# Patient Record
Sex: Female | Born: 1970 | Race: Black or African American | Hispanic: No | Smoking: Never smoker
Health system: Southern US, Community
[De-identification: ages and names within clinical notes are randomized; demographics above are authoritative.]

## PROBLEM LIST (undated history)

## (undated) DIAGNOSIS — Z9289 Personal history of other medical treatment: Secondary | ICD-10-CM

## (undated) DIAGNOSIS — C801 Malignant (primary) neoplasm, unspecified: Secondary | ICD-10-CM

## (undated) DIAGNOSIS — R51 Headache: Secondary | ICD-10-CM

## (undated) DIAGNOSIS — I10 Essential (primary) hypertension: Secondary | ICD-10-CM

## (undated) DIAGNOSIS — L709 Acne, unspecified: Secondary | ICD-10-CM

## (undated) HISTORY — DX: Acne, unspecified: L70.9

---

## 1998-01-21 ENCOUNTER — Other Ambulatory Visit: Admission: RE | Admit: 1998-01-21 | Discharge: 1998-01-21 | Payer: Self-pay | Admitting: Obstetrics & Gynecology

## 1998-06-29 HISTORY — PX: MYOMECTOMY: SHX85

## 1998-09-02 ENCOUNTER — Encounter: Payer: Self-pay | Admitting: Obstetrics & Gynecology

## 1998-09-02 ENCOUNTER — Ambulatory Visit (HOSPITAL_COMMUNITY): Admission: RE | Admit: 1998-09-02 | Discharge: 1998-09-02 | Payer: Self-pay | Admitting: Obstetrics & Gynecology

## 2001-02-03 ENCOUNTER — Other Ambulatory Visit: Admission: RE | Admit: 2001-02-03 | Discharge: 2001-02-03 | Payer: Self-pay | Admitting: Gynecology

## 2001-07-01 ENCOUNTER — Encounter (INDEPENDENT_AMBULATORY_CARE_PROVIDER_SITE_OTHER): Payer: Self-pay | Admitting: Specialist

## 2001-07-01 ENCOUNTER — Inpatient Hospital Stay (HOSPITAL_COMMUNITY): Admission: RE | Admit: 2001-07-01 | Discharge: 2001-07-02 | Payer: Self-pay | Admitting: Gynecology

## 2002-01-31 ENCOUNTER — Other Ambulatory Visit: Admission: RE | Admit: 2002-01-31 | Discharge: 2002-01-31 | Payer: Self-pay | Admitting: Gynecology

## 2002-11-02 ENCOUNTER — Other Ambulatory Visit: Admission: RE | Admit: 2002-11-02 | Discharge: 2002-11-02 | Payer: Self-pay | Admitting: Obstetrics and Gynecology

## 2002-11-03 ENCOUNTER — Encounter (INDEPENDENT_AMBULATORY_CARE_PROVIDER_SITE_OTHER): Payer: Self-pay | Admitting: *Deleted

## 2002-11-03 ENCOUNTER — Ambulatory Visit (HOSPITAL_COMMUNITY): Admission: AD | Admit: 2002-11-03 | Discharge: 2002-11-03 | Payer: Self-pay | Admitting: Obstetrics and Gynecology

## 2003-06-30 DIAGNOSIS — C801 Malignant (primary) neoplasm, unspecified: Secondary | ICD-10-CM

## 2003-06-30 DIAGNOSIS — Z9289 Personal history of other medical treatment: Secondary | ICD-10-CM

## 2003-06-30 HISTORY — PX: DILATION AND CURETTAGE OF UTERUS: SHX78

## 2003-06-30 HISTORY — DX: Malignant (primary) neoplasm, unspecified: C80.1

## 2003-06-30 HISTORY — PX: CHEST WALL BIOPSY: SHX1338

## 2003-06-30 HISTORY — DX: Personal history of other medical treatment: Z92.89

## 2003-07-26 ENCOUNTER — Emergency Department (HOSPITAL_COMMUNITY): Admission: EM | Admit: 2003-07-26 | Discharge: 2003-07-26 | Payer: Self-pay | Admitting: Emergency Medicine

## 2003-08-03 ENCOUNTER — Inpatient Hospital Stay (HOSPITAL_COMMUNITY): Admission: AD | Admit: 2003-08-03 | Discharge: 2003-08-07 | Payer: Self-pay | Admitting: Internal Medicine

## 2003-08-06 ENCOUNTER — Encounter (INDEPENDENT_AMBULATORY_CARE_PROVIDER_SITE_OTHER): Payer: Self-pay | Admitting: Specialist

## 2003-08-10 ENCOUNTER — Other Ambulatory Visit: Admission: RE | Admit: 2003-08-10 | Discharge: 2003-08-10 | Payer: Self-pay | Admitting: Obstetrics and Gynecology

## 2003-08-31 ENCOUNTER — Ambulatory Visit (HOSPITAL_BASED_OUTPATIENT_CLINIC_OR_DEPARTMENT_OTHER): Admission: RE | Admit: 2003-08-31 | Discharge: 2003-08-31 | Payer: Self-pay | Admitting: General Surgery

## 2003-08-31 ENCOUNTER — Ambulatory Visit (HOSPITAL_COMMUNITY): Admission: RE | Admit: 2003-08-31 | Discharge: 2003-08-31 | Payer: Self-pay | Admitting: General Surgery

## 2003-08-31 ENCOUNTER — Encounter: Admission: RE | Admit: 2003-08-31 | Discharge: 2003-08-31 | Payer: Self-pay | Admitting: General Surgery

## 2003-08-31 ENCOUNTER — Encounter (INDEPENDENT_AMBULATORY_CARE_PROVIDER_SITE_OTHER): Payer: Self-pay | Admitting: General Surgery

## 2003-08-31 ENCOUNTER — Encounter (INDEPENDENT_AMBULATORY_CARE_PROVIDER_SITE_OTHER): Payer: Self-pay | Admitting: Specialist

## 2003-10-19 ENCOUNTER — Ambulatory Visit (HOSPITAL_COMMUNITY): Admission: RE | Admit: 2003-10-19 | Discharge: 2003-10-19 | Payer: Self-pay | Admitting: Oncology

## 2004-01-08 ENCOUNTER — Encounter (HOSPITAL_COMMUNITY): Admission: RE | Admit: 2004-01-08 | Discharge: 2004-03-28 | Payer: Self-pay | Admitting: Oncology

## 2004-01-14 ENCOUNTER — Ambulatory Visit (HOSPITAL_COMMUNITY): Admission: RE | Admit: 2004-01-14 | Discharge: 2004-01-14 | Payer: Self-pay | Admitting: Oncology

## 2004-02-11 ENCOUNTER — Ambulatory Visit (HOSPITAL_COMMUNITY): Admission: RE | Admit: 2004-02-11 | Discharge: 2004-02-11 | Payer: Self-pay | Admitting: Oncology

## 2004-05-12 ENCOUNTER — Ambulatory Visit: Payer: Self-pay | Admitting: Oncology

## 2004-08-07 ENCOUNTER — Other Ambulatory Visit: Admission: RE | Admit: 2004-08-07 | Discharge: 2004-08-07 | Payer: Self-pay | Admitting: Obstetrics and Gynecology

## 2004-10-18 IMAGING — CT CT CHEST W/ CM
1 of 2 series · 14 of 32 positions shown, 18 images · IV contrast (agent unspecified)
Comparison: none

CLINICAL DATA: Chest pain. 10 weeks pregnant.  Mediastinal and hilar adenopathy on single PA view chest x-ray on 07/26/03.  Question pulmonary embolus.
 CT CHEST WITH CONTRAST ? 08/03/03

[Series 4: chest/pe 1.0 b10f · axial · 0.59mm/px · z∈[+495,+729]mm · 14 of 260 slices shown, 18 images]
[im 13/260  mediastinal]
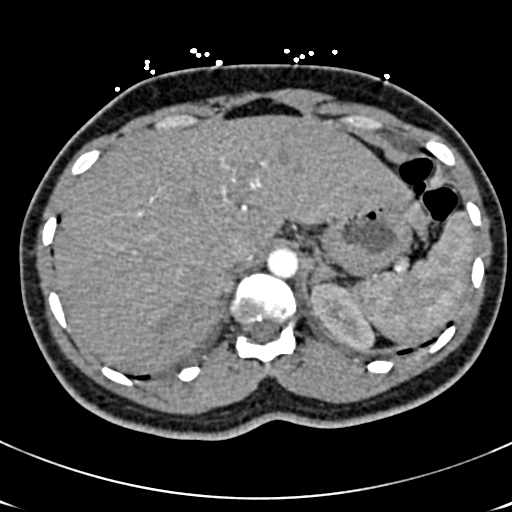
[im 13/260  lung]
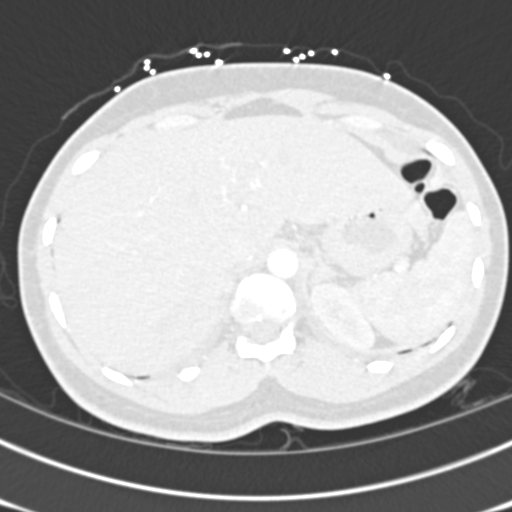
[im 39/260  lung]
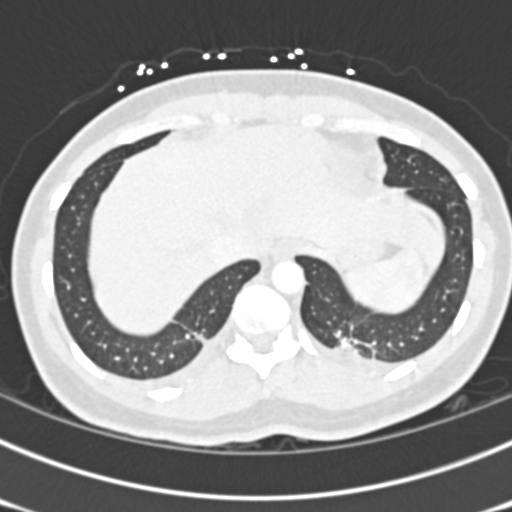
[im 52/260  lung]
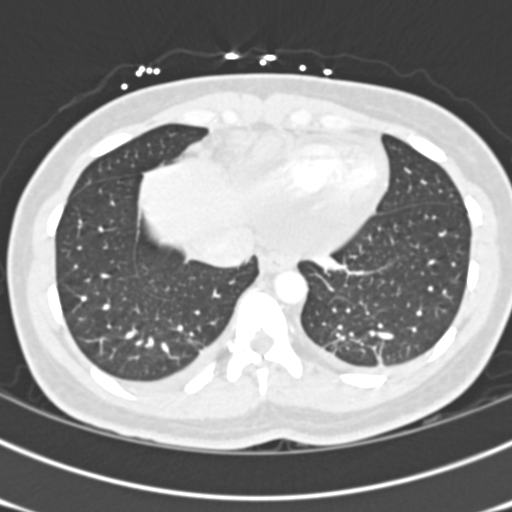
[im 78/260  lung]
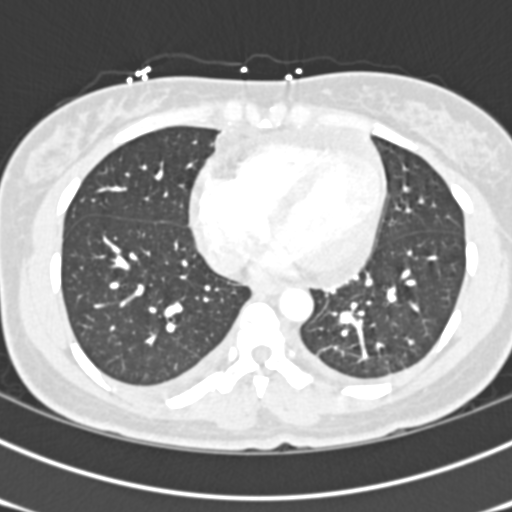
[im 91/260  mediastinal]
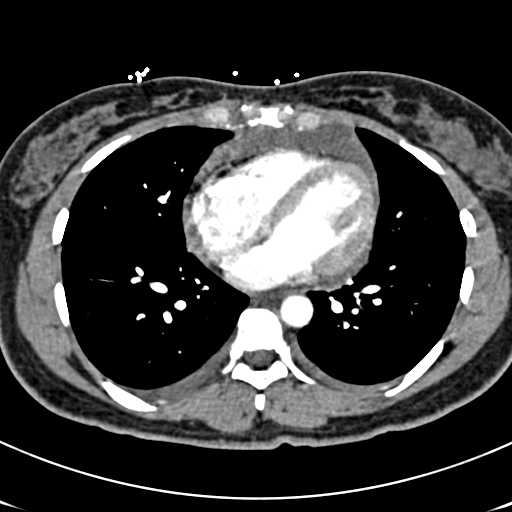
[im 91/260  lung]
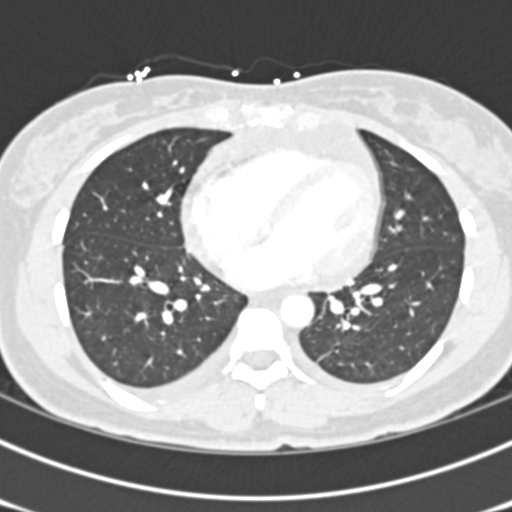
[im 104/260  lung]
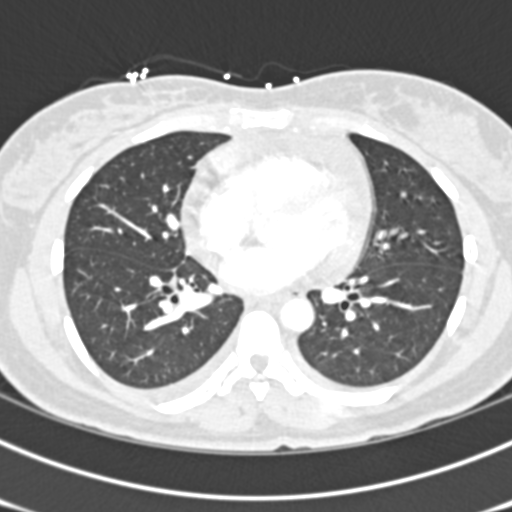
[im 121/260  lung]
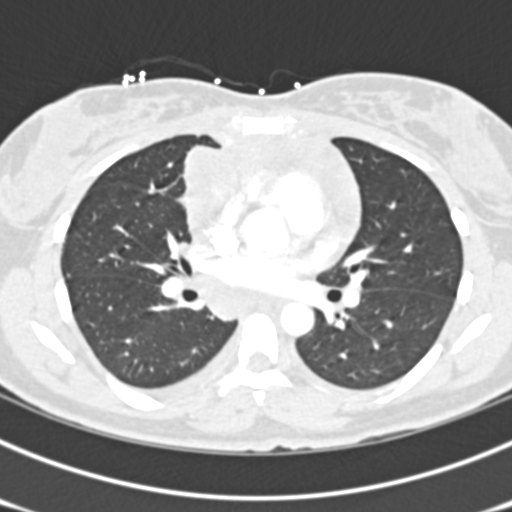
[im 130/260  lung]
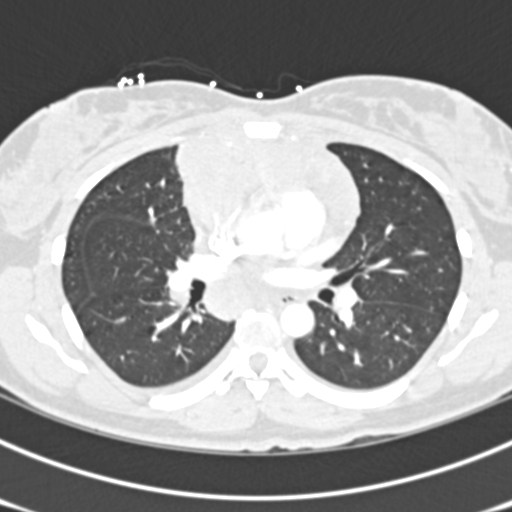
[im 156/260  mediastinal]
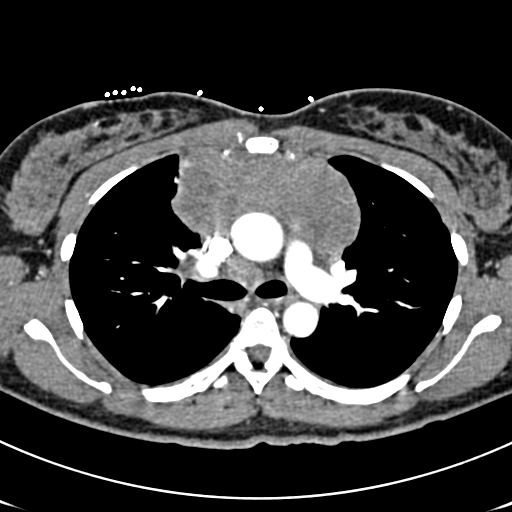
[im 156/260  lung]
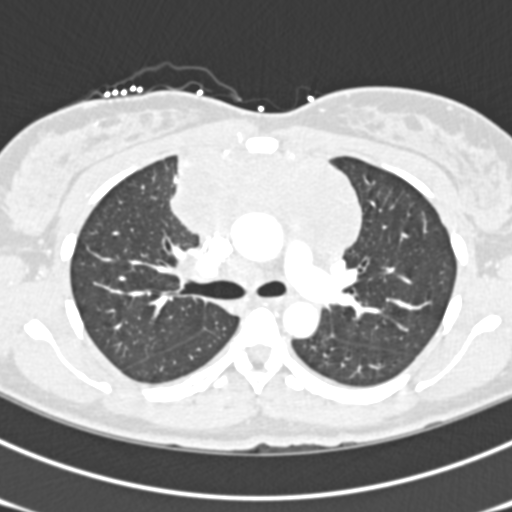
[im 169/260  lung]
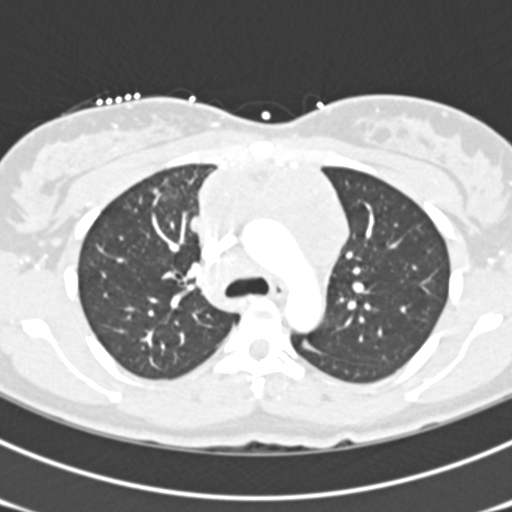
[im 182/260  lung]
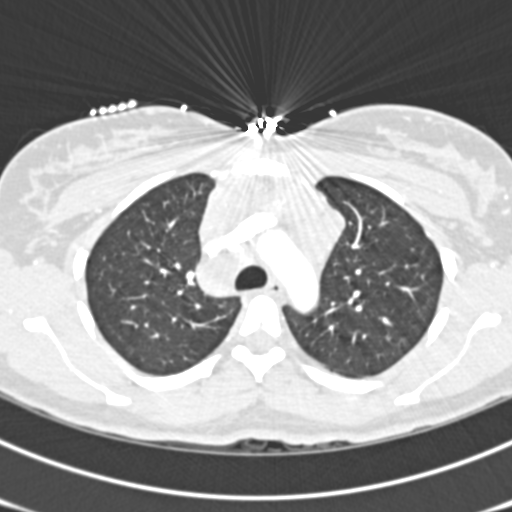
[im 208/260  lung]
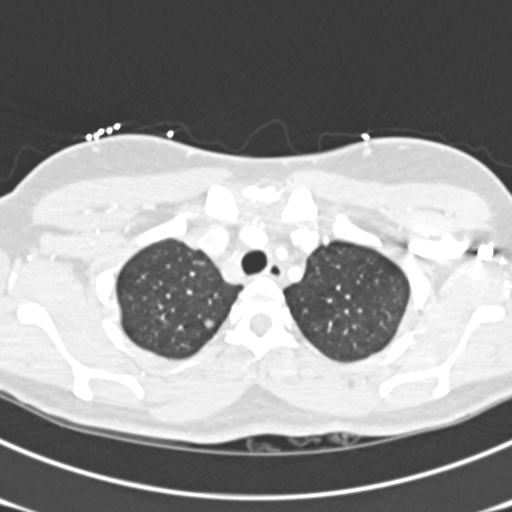
[im 221/260  mediastinal]
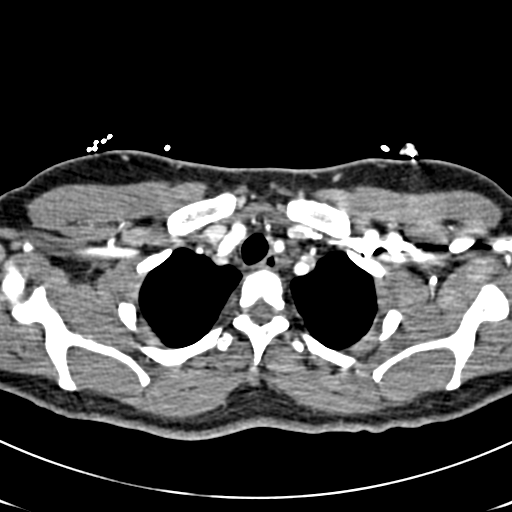
[im 221/260  lung]
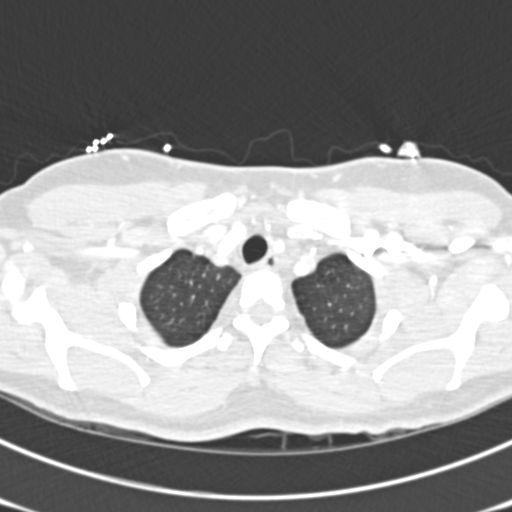
[im 247/260  lung]
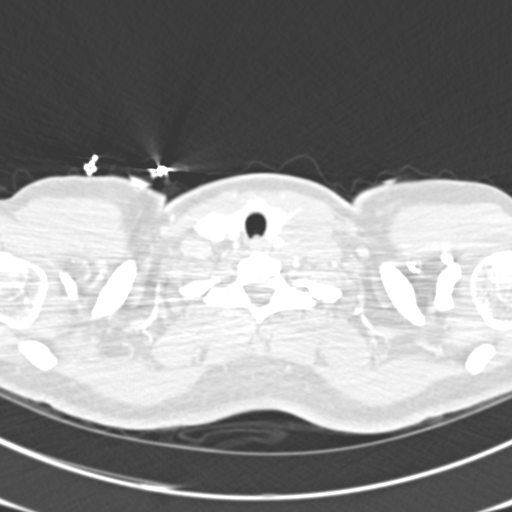

[14 of 32 positions shown; findings below may reference images not displayed]

FINDINGS: Following signed, informed consent for the study 10 weeks pregnant, multidetector spiral axial images were obtained through the thorax with IV injection of 150 cc of Omnipaque 300 with pulmonary embolism protocol.  Abdominal and pelvic shielding with lead aprons.  Comparison with single PA chest x-ray of 07/26/03.
 No CT evidence for pulmonary embolus is seen.  As seen on the abdominal ultrasound of 08/03/03, there are small posterior layering bilateral pleural effusions.  Small to moderate sized pericardial effusion measures maximally anteriorly 1.8 cm (image #58).  As seen on previous chest x-ray, there is marked mediastinal adenopathy extending from the superior mediastinum anteriorly and at the right paratracheal region, continuing to the mid heart level.  The large anterior mediastinal adenopathy measures 3.6 cm AP x 11.4 cm wide (image #37).  Lesser precarinal, subcarinal, and 2.8 cm right hilar adenopathy is noted.  A 2.5 cm right azygoesophageal recess adenopathy and a 2.7 cm anterior right cardiophrenic adenopathy are seen as well.  A 3.3 cm left inferior cervical adenopathy causes marked compression on the left internal jugular vein.  
 The visualized liver and spleen appear normal in size with no focal lesions.  No upper abdominal adenopathy is noted.
 A 6 mm medial right upper lobe (image #19) with small 5 mm left upper lobe subpleural (image #24) noncalcified nodules are seen.  Minimal left greater than right posteromedial subsegmental atelectasis is seen.  The lungs are otherwise clear.  
 IMPRESSION
 As seen on PA chest x-ray of 07/26/03:
 1.  Extensive mediastinal with slight to moderate right hilar adenopathy.  No CT evidence for significant left hilar adenopathy.  Additional left inferior cervical adenopathy is noted.  Again differential diagnosis includes sarcoidosis, lymphoma, and less likely tuberculosis.
 2.  Noncalcified bilateral upper lobe nodules may represent granulomatous disease such as sarcoidosis.
 3.  Slight bibasilar subsegmental atelectasis.
 4.  Slight to moderate pericardial effusion.
 5.  Small right greater than left pleural effusions.
 6.  No CT evidence for pulmonary embolus.

## 2004-10-18 IMAGING — US US ABDOMEN COMPLETE
1 series · 14 of 25 positions shown · non-contrast
Comparison: none

CLINICAL DATA: Right chest pain.  Myomectomy.  10 weeks pregnant.
ULTRASOUND ABDOMEN COMPLETE ? 08/03/03
There is no evidence of gallstones or gallbladder wall thickening. There is no evidence of biliary ductal dilatation. The liver is within normal limits in echogenicity and no focal parenchymal lesions are identified. The visualized portion of the pancreas is unremarkable in appearance.

[Series 1: unknown · 0.30mm/px · 14 of 88 slices shown]
[im 1/88]
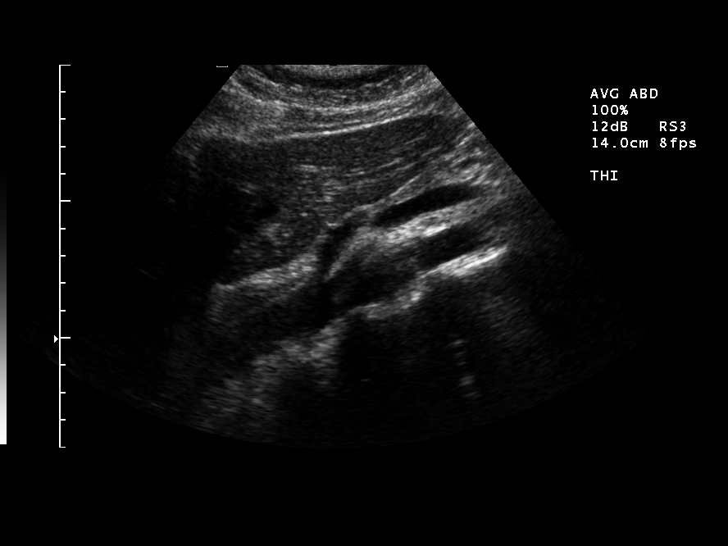
[im 8/88]
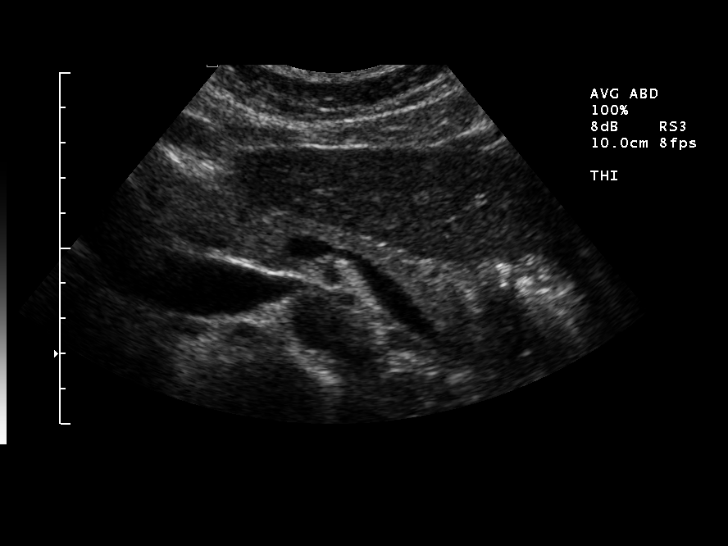
[im 15/88]
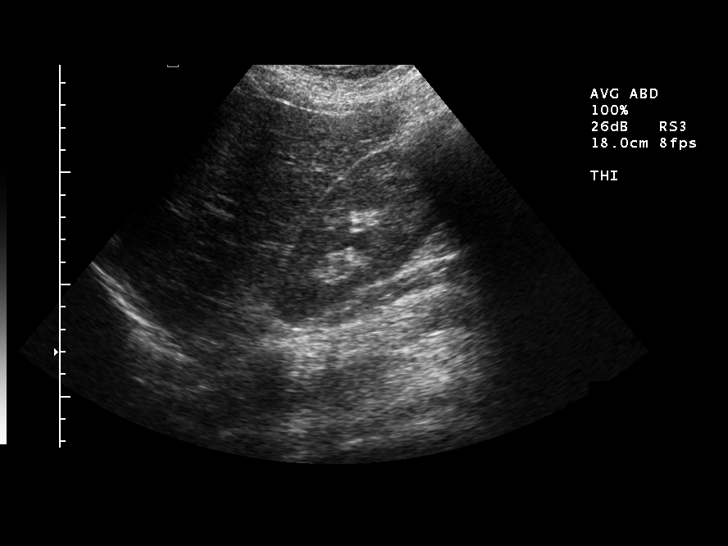
[im 22/88]
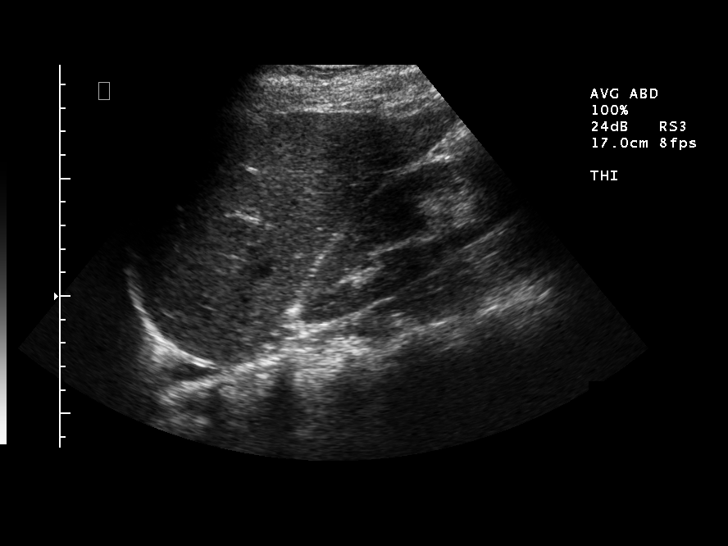
[im 30/88]
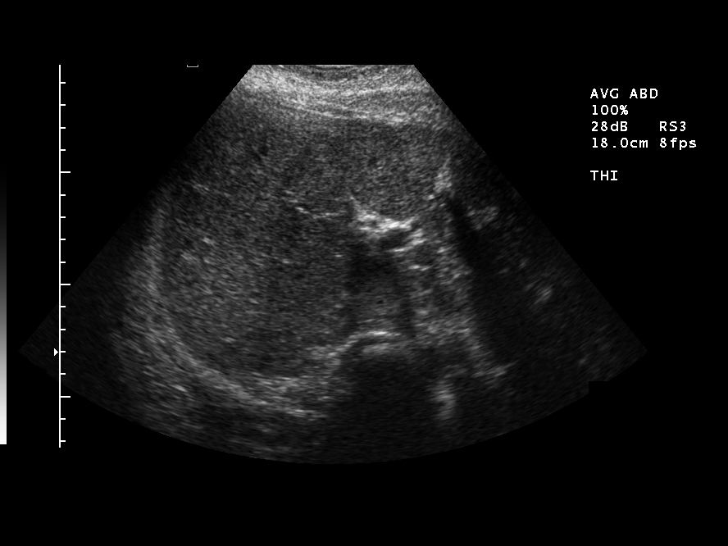
[im 33/88]
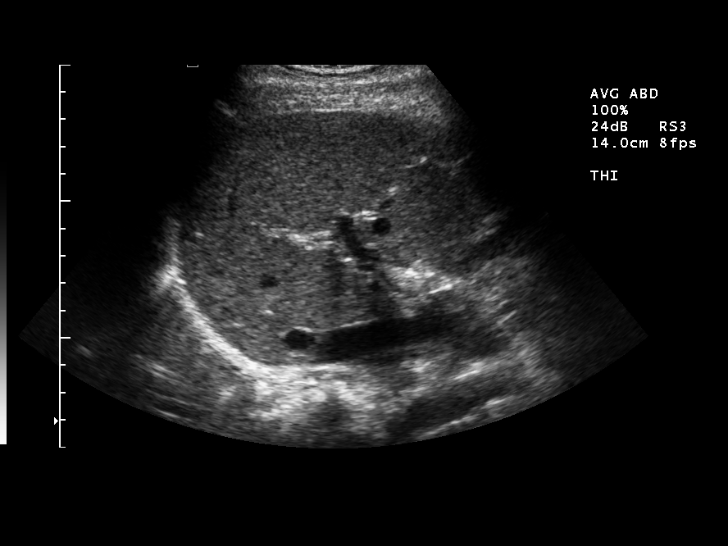
[im 40/88]
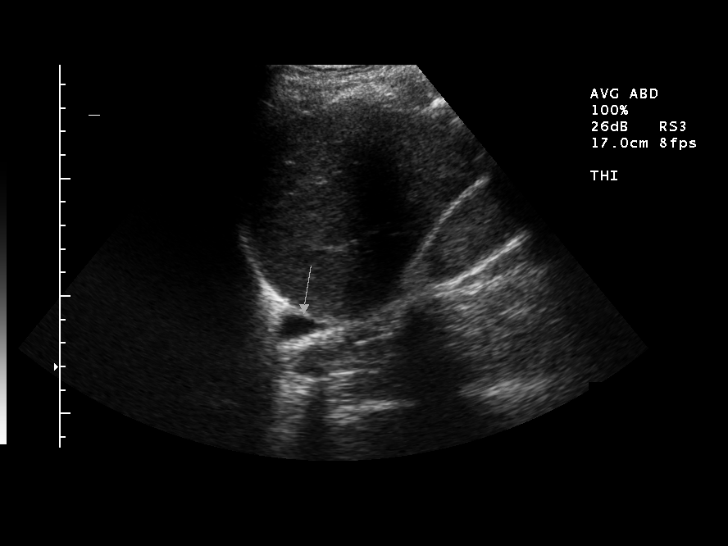
[im 48/88]
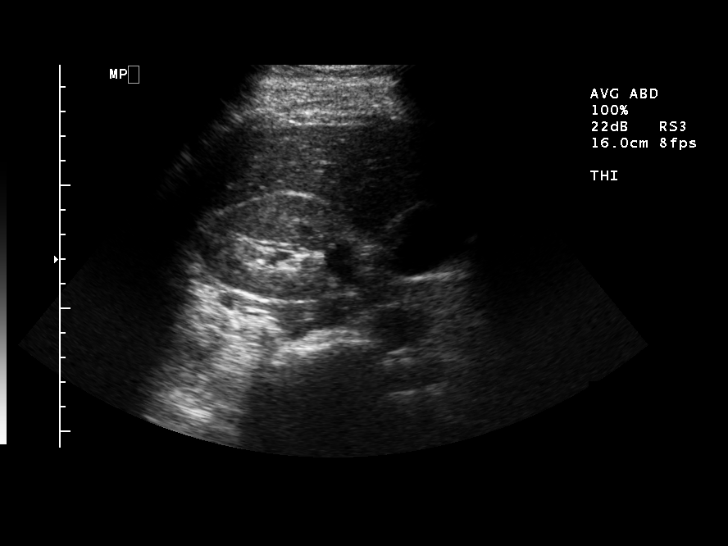
[im 55/88]
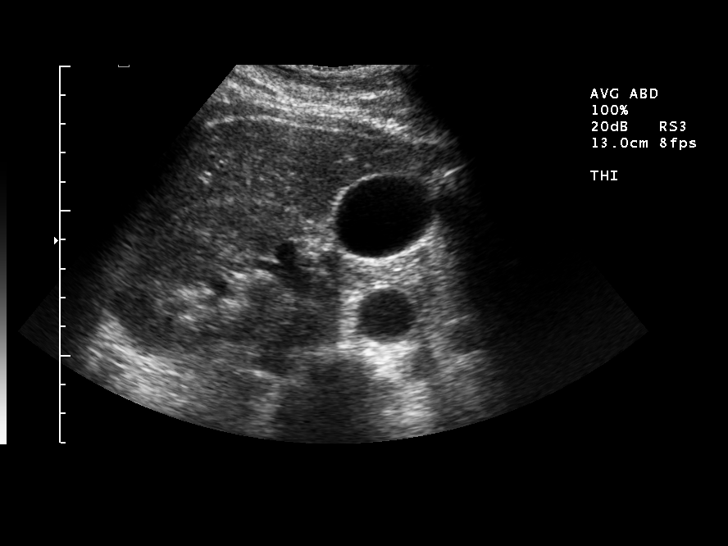
[im 59/88]
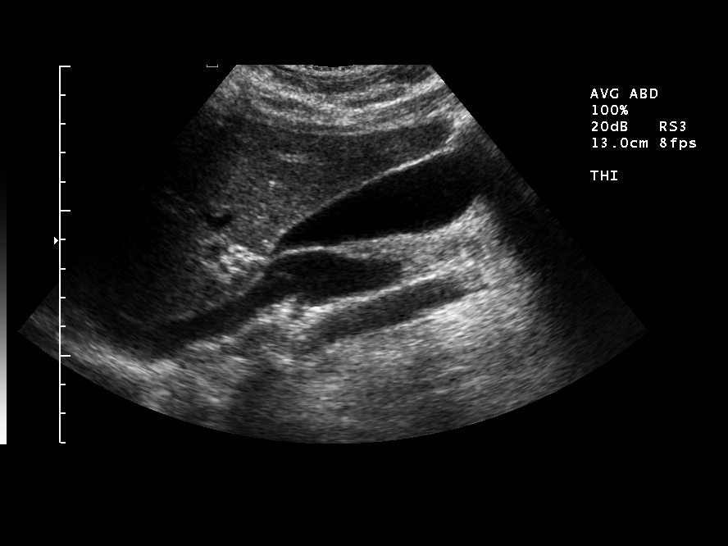
[im 66/88]
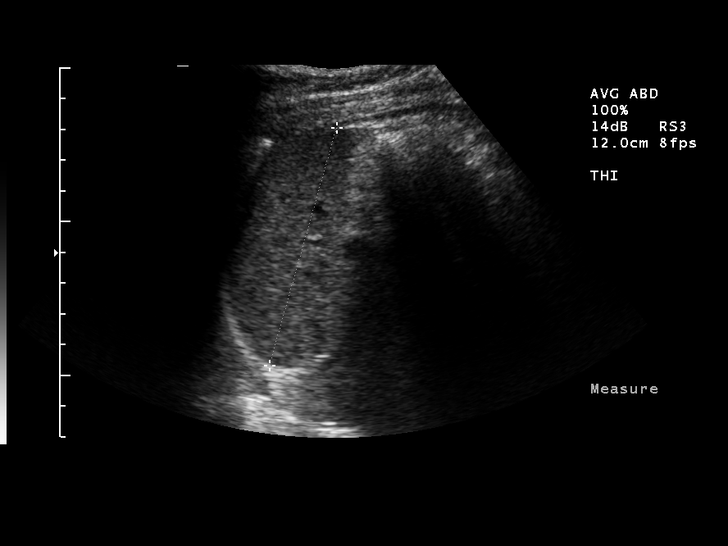
[im 73/88]
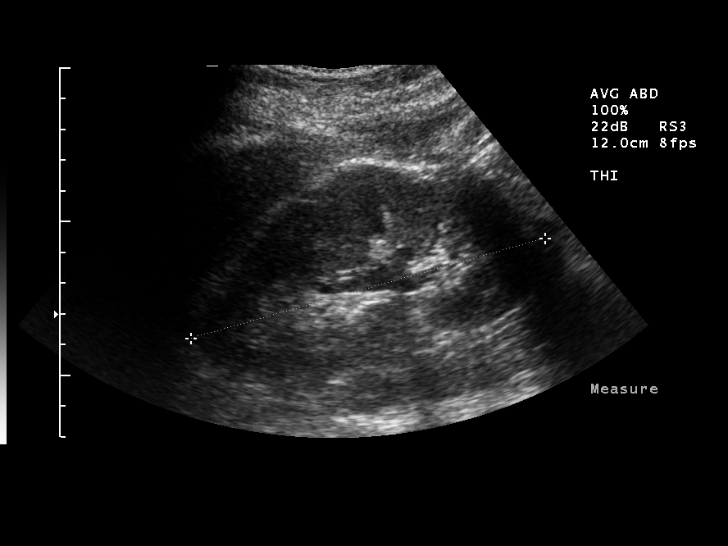
[im 80/88]
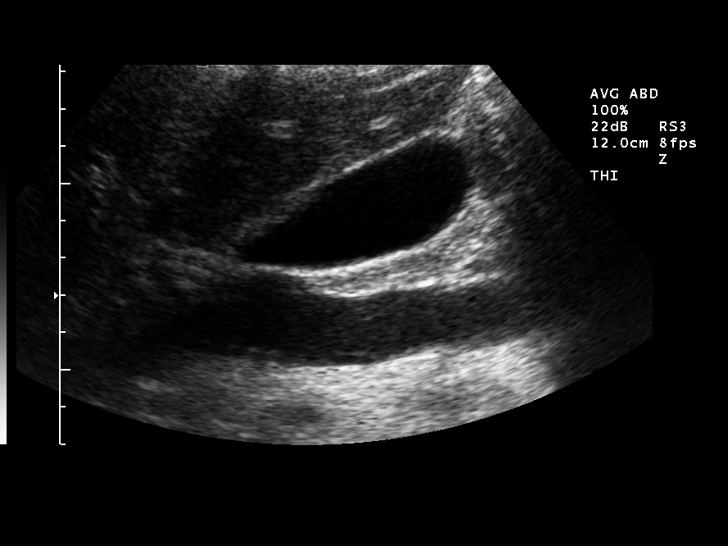
[im 88/88]
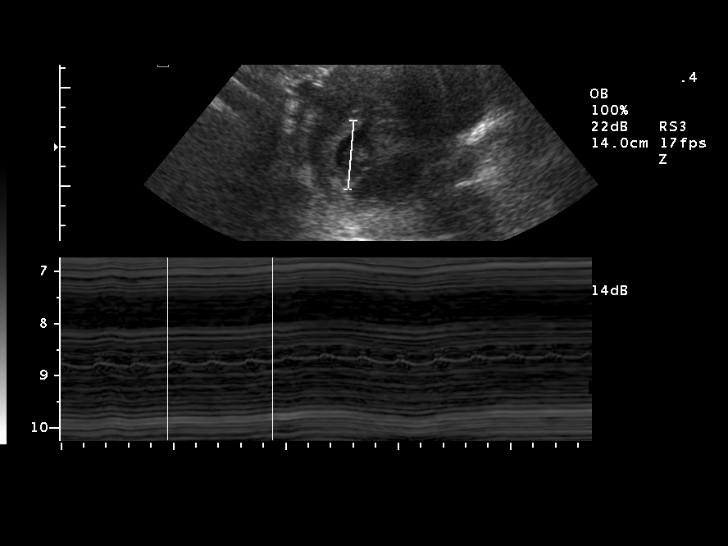

[14 of 25 positions shown; findings below may reference images not displayed]

The kidneys are within normal limits in size and echogenicity and there is no evidence of masses or hydronephrosis.  There is no evidence of splenomegaly or abdominal aortic aneurysm.  Images of the inferior vena cava are unremarkable, and there is no evidence of ascites.   A tiny right pleural effusion is noted.  Limited assessment of intrauterine single gestation shows fetal heart rate at 193 beats per minute which is normal.

IMPRESSION
1.  Tiny right pleural effusion.
2.  Single live intrauterine gestation.
3.  Otherwise normal.

## 2005-06-26 ENCOUNTER — Ambulatory Visit: Payer: Self-pay | Admitting: Oncology

## 2005-10-14 ENCOUNTER — Other Ambulatory Visit: Admission: RE | Admit: 2005-10-14 | Discharge: 2005-10-14 | Payer: Self-pay | Admitting: Physical Therapy

## 2005-12-17 ENCOUNTER — Ambulatory Visit: Payer: Self-pay | Admitting: Oncology

## 2006-05-05 ENCOUNTER — Ambulatory Visit: Payer: Self-pay | Admitting: Oncology

## 2006-05-07 ENCOUNTER — Ambulatory Visit (HOSPITAL_COMMUNITY): Admission: RE | Admit: 2006-05-07 | Discharge: 2006-05-07 | Payer: Self-pay | Admitting: Oncology

## 2006-05-07 LAB — CBC WITH DIFFERENTIAL/PLATELET
Basophils Absolute: 0 10*3/uL (ref 0.0–0.1)
EOS%: 0.9 % (ref 0.0–7.0)
Eosinophils Absolute: 0 10*3/uL (ref 0.0–0.5)
HGB: 13.1 g/dL (ref 11.6–15.9)
LYMPH%: 25.6 % (ref 14.0–48.0)
MCH: 30.4 pg (ref 26.0–34.0)
MCHC: 34.5 g/dL (ref 32.0–36.0)
MCV: 88.1 fL (ref 81.0–101.0)
MONO#: 0.6 10*3/uL (ref 0.1–0.9)
MONO%: 13.1 % — ABNORMAL HIGH (ref 0.0–13.0)
NEUT#: 2.6 10*3/uL (ref 1.5–6.5)
RBC: 4.3 10*6/uL (ref 3.70–5.32)
RDW: 11.9 % (ref 11.3–14.5)
lymph#: 1.1 10*3/uL (ref 0.9–3.3)

## 2006-05-07 LAB — LACTATE DEHYDROGENASE: LDH: 166 U/L (ref 94–250)

## 2006-05-07 LAB — COMPREHENSIVE METABOLIC PANEL
ALT: 21 U/L (ref 0–35)
Albumin: 4.4 g/dL (ref 3.5–5.2)
Alkaline Phosphatase: 63 U/L (ref 39–117)
Calcium: 9.6 mg/dL (ref 8.4–10.5)
Total Protein: 7.4 g/dL (ref 6.0–8.3)

## 2006-11-03 ENCOUNTER — Ambulatory Visit: Payer: Self-pay | Admitting: Oncology

## 2007-07-01 ENCOUNTER — Ambulatory Visit: Payer: Self-pay | Admitting: Oncology

## 2008-05-24 ENCOUNTER — Emergency Department (HOSPITAL_COMMUNITY): Admission: EM | Admit: 2008-05-24 | Discharge: 2008-05-24 | Payer: Self-pay | Admitting: Emergency Medicine

## 2008-06-15 ENCOUNTER — Ambulatory Visit: Payer: Self-pay | Admitting: Oncology

## 2010-11-14 NOTE — H&P (Signed)
NAME:  Candice Hughes, Candice Hughes                       ACCOUNT NO.:  [id number]   MEDICAL RECORD NO.:  [id number]                   PATIENT TYPE:  INP   LOCATION:  0357                                 FACILITY:  [facility]   PHYSICIAN:  Charlaine Dalton. Sherene Sires, M.D. [facility]           DATE OF BIRTH:  10-12-1970   DATE OF ADMISSION:  08/03/2003  DATE OF DISCHARGE:                                HISTORY & PHYSICAL   CHIEF COMPLAINT:  Chest pain and dyspnea.   REFERRING PHYSICIAN:  This patient was referred from the [facility] ER.   HISTORY:  This is a 40 year old black female who reports she is  approximately [redacted] weeks pregnant.  Was evaluated for chest pain in July, 2004  associated with cold symptoms and a hacking cough and chest pain that  clearly was made worse by coughing.  This chest pain was over the center of  her chest, and the chest x-ray that was performed in July, which she brought  with her, was read was normal.  Now she presents with migrating chest pain  that has been coming and going over the last three weeks that she  describes as knife-like, starting in the center of her chest and  radiating, first to the left arm and now today, for the last three days, to  the right arm and right flank.  It is intensely positional and pleuritic and  associated with dyspnea.  Incidentally, the dyspnea comes and goes with the  chest pain.  She denies at this time any associated cough, but it definitely  is made worse by taking a deep breath or coughing.  It is also made worse in  the supine position.  She denies any associated nausea, vomiting, or any  difficulty swallowing, exertional chest pain, fevers, chills, sweats, ocular  or other articular complaints, skin complaints, but thinks she has been  losing weight over the last two weeks and has been nauseated with no  appetite, which she attributes to her pregnancy; however, she denies any  crescendo of the nausea associated with the patient or any relation to  meals  or bowel or bladder activities.   PAST MEDICAL HISTORY:  Significant for fibroids diagnosed in 2003.  She is  status post D&C in 2004.  She has had one miscarriage, never carried a baby  to term.   ALLERGIES:  None known.  She states that she cannot take ASPIRIN but does  not remember why.   MEDICATIONS:  One prenatal vitamin daily.   SOCIAL HISTORY:  She has never smoked.  She works as a Midwife.  She denies any unusual travel or hobby exposure.   FAMILY HISTORY:  Recorded in detail on our inventory work sheet and  significant for the absence of heart disease, rheumatism, sarcoid, or  lymphoma.   REVIEW OF SYSTEMS:  Taken in detail, the work sheet is essentially negative,  except as noted above.  PHYSICAL EXAMINATION:  VITAL SIGNS:  Her blood pressure is 150/98, and her  pulse is around 120 at rest.  GENERAL:  This is a slightly anxious black female in no acute distress.  HEENT:  Oropharynx is clear.  I thought her ocular exam was grossly normal  with no evidence of obvious sarcoid involvement or sarcoid skin changes over  her face or nose.  NECK:  Supple without cervical adenopathy, tenderness, or thyromegaly.  LUNGS:  Lung fields are clear bilaterally to auscultation and percussion,  although she was not taking deep breaths because she stated, of pain on deep  breathing.  There were no definite adventitial breath sounds, specifically,  no rub could be appreciated.  HEART:  Regular rate and rhythm with a 1/6 systolic murmur.  No definite  increase in pulmonic component or S2.  No displacement of the PMI.  ABDOMEN:  Soft, benign.  No tenderness over the right upper quadrant or  elsewhere.  I could not appreciate an enlarged uterus on anterior exam.  EXTREMITIES:  Warm without calf tenderness, clubbing, cyanosis or edema.  NEUROLOGIC:  No focal deficits or pathologic reflexes.  SKIN:  Warm and dry with no sarcoid changes.   Chest x-ray on July 15  suggested to me, in retrospect, slight subtle hilar  and mediastinal adenopathy.  The chest x-ray was performed.  It was  performed in the ER a week ago.  It was said to suggest prominent hilar and  mediastinal adenopathy.   Hemoglobin saturation was 98% on room air.   IMPRESSION:  Unexplained chest pain that is intensely pleuritic and  migratory.  It is certainly worrisome for pulmonary embolism.  Note,  although she is not obese, she is [redacted] weeks pregnant and does also have  unexplained tachycardia.  The differential diagnosis, I suppose, should  include sarcoid, but I have never seen sarcoid cause this type of chest pain  and find that this pain is much more consistent with some form of serositis  or pleuritis, (other rheumatologic disease and sarcoid); therefore, because  pulmonary embolism is the most potentially life-threatening disease, I am  going to recommend admission to the hospital today and explain to her the  risks of undiagnosed pulmonary embolism versus the risk of radiation,  considering that she is now [redacted] weeks pregnant and presumably all or most of  her organogenesis is complete.  I told her we would do the best we could to  do our shielding and also proceed with as much noninvasive studies as  possible, including broad lab profile and an ultrasound of her upper  abdomen, looking for any abnormality of the gallbladder or liver that might  explain similar pain.  So for now, the plan is to admit her to the hospital  and try to work out specific causes of the pain.   ADDENDUM:  I did a literature search on sarcoid flare with pregnancy.  The  most studies with sarcoid and pregnancy state that sarcoid actually becomes  less active in pregnancy and tends to flare in the postpartum, for reasons  that are not clear, but flareups of sarcoid during pregnancy are apparently  distinctly unusual.                                               Casimiro Needle B. Sherene Sires, M.D.  [facility]   MBW/MEDQ  D:  08/03/2003  T:  08/03/2003  Job:  [id number]

## 2010-11-14 NOTE — Discharge Summary (Signed)
NAME:  Candice Hughes, Candice Hughes                       ACCOUNT NO.:  [id number]   MEDICAL RECORD NO.:  [id number]                   PATIENT TYPE:  INP   LOCATION:  0361                                 FACILITY:  [facility]   PHYSICIAN:  Charlaine Dalton. Sherene Sires, M.D. [facility]           DATE OF BIRTH:  April 03, 1971   DATE OF ADMISSION:  08/03/2003  DATE OF DISCHARGE:  08/07/2003                                 DISCHARGE SUMMARY   FINAL DIAGNOSES:  1. Extensive mediastinal hilar adenopathy associated with small bilateral     pleural effusions and pericardial effusions of undetermined etiology.     a. Bronchoscopy this admission August 06, 2003 without any evidence of        endobronchial sarcoid but transbronchial biopsies are pending.     b. Mediastinoscopy will be considered after results are known.  2. Approximately 10-week intrauterine pregnancy by dates.   HISTORY:  Please see dictated H&P.  I saw this patient on a Friday  afternoon, August 03, 2003.  Concerned that she was [redacted] weeks pregnant and  having unexplained pleuritic pain that initially had involved the left side  and now presented with right-sided pleuritic pain and dyspnea as well as  tachycardia.  She had documented adenopathy on chest x-ray and had been  referred to me as a possible sarcoid.   The acute onset of her illness and the pattern of pain were typical of  sarcoid and recommended a CT scan which revealed in addition to bulky hilar  and mediastinal adenopathy (more on the right than the left), evidence of  right greater than left pleural effusion, and a small pericardial effusion.  I treated her Tylenol No. 3 and obtained a broad lab profile that has been  unrevealing to date showing no elevation of ACE, a sed rate of 44, and no  other abnormalities on CBC or chemistry profile.  Specifically no evidence  of hypercalcemia, a normal protein to albumin ratio.   She underwent bronchoscopy yesterday and was feeling better with just  Tylenol  No. 3 for pain, so I am going to discharge her today and follow her  up in two days in the office to consider additional studies to try to shed  further light on this process with the differential diagnosis being sarcoid  versus lymphoma versus other granulomatous processes.  No evidence, however,  of pulmonary embolism.  Echocardiogram also is pending at the time of this  dictation but the preliminary shows only a very small pericardial effusion.   Her pain and dyspnea are improved on Tylenol No. 3 on a p.r.n. basis at the  time of discharge and we will see her back in 2 days on a regular diet with  increased activity as tolerated but not to return to work until I see her in  the office.  Charlaine Dalton. Sherene Sires, M.D. [facility]    MBW/MEDQ  D:  08/07/2003  T:  08/07/2003  Job:  [id number]   cc:   Dr. Vance Gather Office

## 2010-11-14 NOTE — Op Note (Signed)
NAME:  Candice Hughes, Candice Hughes                       ACCOUNT NO.:  [id number]   MEDICAL RECORD NO.:  [id number]                   PATIENT TYPE:  AMB   LOCATION:  DSC                                  FACILITY:  [facility]   PHYSICIAN:  Jimmye Norman III, M.D.               DATE OF BIRTH:  1971/06/05   DATE OF PROCEDURE:  08/31/2003  DATE OF DISCHARGE:                                 OPERATIVE REPORT   PREOPERATIVE DIAGNOSIS:  Hilar adenopathy and left cervical adenopathy.   POSTOPERATIVE DIAGNOSIS:  Hilar adenopathy and left cervical adenopathy.   PROCEDURE:  Left cervical lymph node biopsy.   SURGEON:  Jimmye Norman, M.D.   ANESTHESIA:  Monitored anesthesia care with 6 mL of 1% Xylocaine local  without epinephrine.   ESTIMATED BLOOD LOSS:  25 mL.   COMPLICATIONS:  None.   CONDITION:  Stable.   INDICATIONS FOR OPERATION:  The patient is a 40 year old female who has been  plagued with an upper respiratory problem for several weeks now with a  previous hospitalization and chest x-rays demonstrating mediastinal  adenopathy.  She comes to me now for a lymph node biopsy.   OPERATION:  The patient was taken to the operating room and placed on the  table in supine position.  After an adequate amount of IV sedation was  given, she was prepped and draped in the usual sterile manner exposing her  left neck, which was turned to the right.   After the patient had been adequately sedated, we injected the next to last  inferior skin fold along that line using 1% Xylocaine without epinephrine.  We subsequently used a 15 blade to make a skin incision down through the  subcutaneous tissue, then subsequently through the platysmal layer.  It was  at the posterior belly of the sternocleidomastoid muscle that we were able  to retract anteriorly along with the anterior jugular vein.  In the deep  cervical fascia we were able to dissect out parts of a very friable lymph  node, part of which was done sharply  with Metzenbaums and also  electrocautery, the other part of which had to be bluntly dissected out  using finger dissection.  As mentioned previously, the cervical lymph node  appeared to be matted, friable, and somewhat hard.   We removed the lymph nodes in pieces.  About four pieces were taken out.  Then we irrigated with saline, cauterized for hemostasis, and packed it with  Surgicel for hemostasis.  Once we had adequate hemostasis we left the  Surgicel in place.  We closed the split sternocleidomastoid muscles using  interrupted 4-0 Vicryl suture, then the  platysmal layer was reapproximated using interrupted 4-0 Vicryl.  The skin  was closed using a running subcuticular stitch of 5-0 Vicryl.  All needle  counts, sponge counts, and instrument counts were correct.  The patient was  taken to the recovery room in stable condition.  Kathrin Ruddy, M.D.    JW/MEDQ  D:  08/31/2003  T:  09/01/2003  Job:  [id number]

## 2011-03-31 LAB — POCT I-STAT, CHEM 8
BUN: 15
BUN: 31 — ABNORMAL HIGH
Chloride: 103
Chloride: 99
Creatinine, Ser: 4.7 — ABNORMAL HIGH
Glucose, Bld: 265 — ABNORMAL HIGH
HCT: 36
Potassium: 3.6
Sodium: 133 — ABNORMAL LOW
TCO2: 25
TCO2: 28

## 2011-12-07 ENCOUNTER — Encounter (HOSPITAL_COMMUNITY): Payer: Self-pay | Admitting: Pharmacist

## 2011-12-15 ENCOUNTER — Encounter (HOSPITAL_COMMUNITY): Payer: Self-pay

## 2011-12-15 ENCOUNTER — Encounter (HOSPITAL_COMMUNITY)
Admission: RE | Admit: 2011-12-15 | Discharge: 2011-12-15 | Disposition: A | Discharge status: Home / Self Care | Payer: BC Managed Care – PPO | Source: Ambulatory Visit | Attending: Obstetrics and Gynecology | Admitting: Obstetrics and Gynecology

## 2011-12-15 HISTORY — DX: Essential (primary) hypertension: I10

## 2011-12-15 HISTORY — DX: Malignant (primary) neoplasm, unspecified: C80.1

## 2011-12-15 HISTORY — DX: Headache: R51

## 2011-12-15 HISTORY — DX: Personal history of other medical treatment: Z92.89

## 2011-12-15 LAB — BASIC METABOLIC PANEL
BUN: 15 mg/dL (ref 6–23)
CO2: 29 mEq/L (ref 19–32)
Chloride: 97 mEq/L (ref 96–112)
GFR calc non Af Amer: >90 mL/min (ref >90)
Potassium: 3.4 mEq/L — ABNORMAL LOW (ref 3.5–5.1)

## 2011-12-15 LAB — CBC
MCH: 29.6 pg (ref 26.0–34.0)
MCHC: 34.3 g/dL (ref 30.0–36.0)
Platelets: 312 10*3/uL (ref 150–400)
WBC: 4.8 10*3/uL (ref 4.0–10.5)

## 2011-12-15 LAB — SURGICAL PCR SCREEN: Staphylococcus aureus: NEGATIVE

## 2011-12-15 NOTE — Patient Instructions (Addendum)
   Your procedure is scheduled on: Monday June 24th  Enter through the Main Entrance of [facility] at:6am Pick up the phone at the desk and dial [phone] and inform us of your arrival.  Please call this number if you have any problems the morning of surgery: [phone]  Remember: Do not eat food after midnight: Sunday Do not drink clear liquids after: midnight Sunday Take these medicines the morning of surgery with a SIP OF WATER: take blood pressure medicine morning of surgery  Do not wear jewelry, make-up, or FINGER nail polish Do not wear lotions, powders, perfumes or deodorant. Do not shave 48 hours prior to surgery. Do not bring valuables to the hospital. Contacts, dentures or bridgework may not be worn into surgery.  Leave suitcase in the car. After Surgery it may be brought to your room. For patients being admitted to the hospital, checkout time is 11:00am the day of discharge.     Remember to use your hibiclens as instructed.Please shower with 1/2 bottle the evening before your surgery and the other 1/2 bottle the morning of surgery. Neck down avoiding private area.

## 2011-12-15 NOTE — Pre-Procedure Instructions (Signed)
Pt has been on otc diet supplement-Slim Trim U x 1 month-spoke with Dr Lacie Draft medication today. Ok for surgery

## 2011-12-15 NOTE — H&P (Addendum)
   Candice Hughes presents today for preop evaluation for hysterectomy.  She has been having worsening pelvic pain, pressure, discomfort and bleeding which is excessive.  She is now controlled on birth control pills and recently was placed on antihypertensives and is followed by Dr. Renae Gloss for hypertension.  Pelvic ultrasound and pelvic exam are consistent with a 14-week size fibroid uterus.  She has had a myomectomy in the past.  Previous ultrasound shows the largest fibroid to be 5 cm in size with multiple fibroids measuring 4.8, 5.0, 3.9, 2.3 and at least 5 or 6 more.  Ovaries appear to be normal.  She had a recent FSH showing an FSH of 16.8.  She is not having any menopausal type symptoms.  She desires definitive surgical intervention and presents for hysterectomy.  Her past medical history is significant for Hodgkin's disease, currently in remission, and hypertension.  Past surgical history:  History of myomectomy, history of biopsy, port-a-cath placement and D&C.  Medications are lisinpril, hydrochlorothiazide and she is on Lo-Loestrin to control the bleeding at this time and antihistamines for seasonal allergies.  She has no known drug allergies. O: Physical exam:  Blood pressure is 138/88.  Heart:  Regular rate and rhythm.  Lungs clear to auscultation bilaterally.  Abdomen is nondistended, nontender.  Uterus is palpated 3-4 finger breadths above the pubic symphysis.  A well-healed umbilical scar is noted.  She also has a 1-2 cm umbilical hernia.  Pelvic exam:  She has a 14-week size globular fibroid uterus, nontender. A&P: 14-week size fibroid uterus, history of myomectomy, pelvic pressure, pain and menorrhagia.  She desires definitive surgical intervention.  Plan laparotomy with myomectomy.  She desires preservation of the ovaries.  Discussed the risks and benefits of the procedure at length including but not limited to risk of infection, bleeding, damage to bowel, bladder, ureters, ovaries.  She does want to  preserve the ovaries, even though she may be perimenopausal.  She does give permission to remove one or both ovaries if they do not appear to be normal.  All of her questions were answered.  Discussed the procedure, its risks and benefits and success rate, complication rates.  All of her questions were answered. Dineen Kid Rana Snare, MD/rad  This patient has been seen and examined.   All of her questions were answered.  Labs and vital signs reviewed.  Informed consent has been obtained.  The History and Physical is current.  12/21/11 0715 DL

## 2011-12-20 MED ORDER — DEXTROSE 5 % IV SOLN
2.0000 g | INTRAVENOUS | Status: AC
  Administered 2011-12-21: 2 g via INTRAVENOUS
  Filled 2011-12-20: qty 2

## 2011-12-21 ENCOUNTER — Encounter (HOSPITAL_COMMUNITY): Payer: Self-pay | Admitting: Anesthesiology

## 2011-12-21 ENCOUNTER — Inpatient Hospital Stay (HOSPITAL_COMMUNITY)
Admission: RE | Admit: 2011-12-21 | Discharge: 2011-12-23 | DRG: 359 | Disposition: A | Discharge status: Home / Self Care | Payer: BC Managed Care – PPO | Source: Ambulatory Visit | Attending: Obstetrics and Gynecology | Admitting: Obstetrics and Gynecology

## 2011-12-21 ENCOUNTER — Inpatient Hospital Stay (HOSPITAL_COMMUNITY): Payer: BC Managed Care – PPO | Admitting: Anesthesiology

## 2011-12-21 ENCOUNTER — Encounter (HOSPITAL_COMMUNITY): Payer: Self-pay

## 2011-12-21 ENCOUNTER — Encounter (HOSPITAL_COMMUNITY)
Admission: RE | Disposition: A | Discharge status: Home / Self Care | Payer: Self-pay | Source: Ambulatory Visit | Attending: Obstetrics and Gynecology

## 2011-12-21 DIAGNOSIS — D252 Subserosal leiomyoma of uterus: Secondary | ICD-10-CM | POA: Diagnosis present

## 2011-12-21 DIAGNOSIS — Z01812 Encounter for preprocedural laboratory examination: Secondary | ICD-10-CM

## 2011-12-21 DIAGNOSIS — Z90710 Acquired absence of both cervix and uterus: Secondary | ICD-10-CM

## 2011-12-21 DIAGNOSIS — Z01818 Encounter for other preprocedural examination: Secondary | ICD-10-CM

## 2011-12-21 DIAGNOSIS — D250 Submucous leiomyoma of uterus: Secondary | ICD-10-CM | POA: Diagnosis present

## 2011-12-21 DIAGNOSIS — D251 Intramural leiomyoma of uterus: Secondary | ICD-10-CM | POA: Diagnosis present

## 2011-12-21 DIAGNOSIS — I10 Essential (primary) hypertension: Secondary | ICD-10-CM | POA: Diagnosis present

## 2011-12-21 DIAGNOSIS — N8000 Endometriosis of the uterus, unspecified: Secondary | ICD-10-CM | POA: Diagnosis present

## 2011-12-21 DIAGNOSIS — N920 Excessive and frequent menstruation with regular cycle: Secondary | ICD-10-CM | POA: Diagnosis present

## 2011-12-21 DIAGNOSIS — N949 Unspecified condition associated with female genital organs and menstrual cycle: Principal | ICD-10-CM | POA: Diagnosis present

## 2011-12-21 HISTORY — PX: ABDOMINAL HYSTERECTOMY: SHX81

## 2011-12-21 LAB — GLUCOSE, CAPILLARY
Glucose-Capillary: 166 mg/dL — ABNORMAL HIGH (ref 70–99)
Glucose-Capillary: 190 mg/dL — ABNORMAL HIGH (ref 70–99)
Glucose-Capillary: 145 mg/dL — ABNORMAL HIGH (ref 70–99)

## 2011-12-21 SURGERY — HYSTERECTOMY, ABDOMINAL
Anesthesia: General | Site: Abdomen | Wound class: Clean Contaminated

## 2011-12-21 MED ORDER — HYDROMORPHONE 0.3 MG/ML IV SOLN
INTRAVENOUS | Status: DC
  Administered 2011-12-21: 2.8 mg via INTRAVENOUS
  Administered 2011-12-21: 11:00:00 via INTRAVENOUS
  Filled 2011-12-21: qty 25

## 2011-12-21 MED ORDER — MENTHOL 3 MG MT LOZG: 1.0000 | LOZENGE | OROMUCOSAL | Status: DC | PRN

## 2011-12-21 MED ORDER — LOSARTAN POTASSIUM-HCTZ 100-12.5 MG PO TABS: 1.0000 | ORAL_TABLET | Freq: Every day | ORAL | Status: DC

## 2011-12-21 MED ORDER — MIDAZOLAM HCL 2 MG/2ML IJ SOLN
INTRAMUSCULAR | Status: AC
  Filled 2011-12-21: qty 2

## 2011-12-21 MED ORDER — HYDROMORPHONE HCL PF 1 MG/ML IJ SOLN
INTRAMUSCULAR | Status: AC
  Filled 2011-12-21: qty 1

## 2011-12-21 MED ORDER — FENTANYL CITRATE 0.05 MG/ML IJ SOLN
INTRAMUSCULAR | Status: AC
  Filled 2011-12-21: qty 5

## 2011-12-21 MED ORDER — LOSARTAN POTASSIUM 50 MG PO TABS
100.0000 mg | ORAL_TABLET | Freq: Every day | ORAL | Status: DC
  Administered 2011-12-22 – 2011-12-23 (×2): 100 mg via ORAL
  Filled 2011-12-21 (×2): qty 2

## 2011-12-21 MED ORDER — OXYCODONE-ACETAMINOPHEN 5-325 MG PO TABS
1.0000 | ORAL_TABLET | ORAL | Status: DC | PRN
  Administered 2011-12-22 – 2011-12-23 (×4): 1 via ORAL
  Filled 2011-12-21 (×4): qty 1

## 2011-12-21 MED ORDER — GLYCOPYRROLATE 0.2 MG/ML IJ SOLN
INTRAMUSCULAR | Status: AC
  Filled 2011-12-21: qty 1

## 2011-12-21 MED ORDER — IBUPROFEN 600 MG PO TABS
600.0000 mg | ORAL_TABLET | Freq: Four times a day (QID) | ORAL | Status: DC | PRN
  Administered 2011-12-21 – 2011-12-22 (×3): 600 mg via ORAL
  Filled 2011-12-21 (×3): qty 1

## 2011-12-21 MED ORDER — SODIUM CHLORIDE 0.9 % IJ SOLN: 9.0000 mL | INTRAMUSCULAR | Status: DC | PRN

## 2011-12-21 MED ORDER — ONDANSETRON HCL 4 MG/2ML IJ SOLN
4.0000 mg | Freq: Four times a day (QID) | INTRAMUSCULAR | Status: DC | PRN
  Administered 2011-12-21: 4 mg via INTRAVENOUS
  Filled 2011-12-21: qty 2

## 2011-12-21 MED ORDER — HYDROCHLOROTHIAZIDE 12.5 MG PO CAPS
12.5000 mg | ORAL_CAPSULE | Freq: Every day | ORAL | Status: DC
  Administered 2011-12-22 – 2011-12-23 (×2): 12.5 mg via ORAL
  Filled 2011-12-21 (×2): qty 1

## 2011-12-21 MED ORDER — MIDAZOLAM HCL 5 MG/5ML IJ SOLN
INTRAMUSCULAR | Status: DC | PRN
  Administered 2011-12-21: 2 mg via INTRAVENOUS

## 2011-12-21 MED ORDER — ZOLPIDEM TARTRATE 5 MG PO TABS: 5.0000 mg | ORAL_TABLET | Freq: Every evening | ORAL | Status: DC | PRN

## 2011-12-21 MED ORDER — DIPHENHYDRAMINE HCL 12.5 MG/5ML PO ELIX: 12.5000 mg | ORAL_SOLUTION | Freq: Four times a day (QID) | ORAL | Status: DC | PRN

## 2011-12-21 MED ORDER — ROCURONIUM BROMIDE 50 MG/5ML IV SOLN
INTRAVENOUS | Status: AC
  Filled 2011-12-21: qty 1

## 2011-12-21 MED ORDER — PROPOFOL 10 MG/ML IV EMUL
INTRAVENOUS | Status: DC | PRN
  Administered 2011-12-21: 170 mg via INTRAVENOUS

## 2011-12-21 MED ORDER — LIDOCAINE HCL (CARDIAC) 20 MG/ML IV SOLN
INTRAVENOUS | Status: DC | PRN
  Administered 2011-12-21: 50 mg via INTRAVENOUS

## 2011-12-21 MED ORDER — FENTANYL CITRATE 0.05 MG/ML IJ SOLN
INTRAMUSCULAR | Status: DC | PRN
  Administered 2011-12-21 (×2): 100 ug via INTRAVENOUS
  Administered 2011-12-21: 150 ug via INTRAVENOUS

## 2011-12-21 MED ORDER — NEOSTIGMINE METHYLSULFATE 1 MG/ML IJ SOLN
INTRAMUSCULAR | Status: DC | PRN
  Administered 2011-12-21: 3 mg via INTRAVENOUS

## 2011-12-21 MED ORDER — HYDROMORPHONE HCL PF 1 MG/ML IJ SOLN: 1.0000 mg | Freq: Once | INTRAMUSCULAR | Status: DC

## 2011-12-21 MED ORDER — DEXTROSE-NACL 5-0.45 % IV SOLN
INTRAVENOUS | Status: DC
  Administered 2011-12-21 (×2): via INTRAVENOUS

## 2011-12-21 MED ORDER — HYDROMORPHONE HCL PF 1 MG/ML IJ SOLN
0.2500 mg | INTRAMUSCULAR | Status: DC | PRN
  Administered 2011-12-21: 0.5 mg via INTRAVENOUS
  Administered 2011-12-21: 0.25 mg via INTRAVENOUS
  Administered 2011-12-21: 0.5 mg via INTRAVENOUS
  Administered 2011-12-21: 0.25 mg via INTRAVENOUS
  Administered 2011-12-21: 1 mg via INTRAVENOUS
  Administered 2011-12-21: 0.5 mg via INTRAVENOUS

## 2011-12-21 MED ORDER — HYDROMORPHONE HCL PF 1 MG/ML IJ SOLN
INTRAMUSCULAR | Status: AC
  Administered 2011-12-21: 0.25 mg via INTRAVENOUS
  Filled 2011-12-21: qty 1

## 2011-12-21 MED ORDER — LACTATED RINGERS IV SOLN
INTRAVENOUS | Status: DC
  Administered 2011-12-21 (×3): via INTRAVENOUS

## 2011-12-21 MED ORDER — NEOSTIGMINE METHYLSULFATE 1 MG/ML IJ SOLN
INTRAMUSCULAR | Status: AC
  Filled 2011-12-21: qty 10

## 2011-12-21 MED ORDER — FENTANYL CITRATE 0.05 MG/ML IJ SOLN
INTRAMUSCULAR | Status: AC
  Filled 2011-12-21: qty 2

## 2011-12-21 MED ORDER — DEXAMETHASONE SODIUM PHOSPHATE 4 MG/ML IJ SOLN
INTRAMUSCULAR | Status: DC | PRN
  Administered 2011-12-21: 5 mg via INTRAVENOUS

## 2011-12-21 MED ORDER — DIPHENHYDRAMINE HCL 50 MG/ML IJ SOLN: 12.5000 mg | Freq: Four times a day (QID) | INTRAMUSCULAR | Status: DC | PRN

## 2011-12-21 MED ORDER — KETOROLAC TROMETHAMINE 30 MG/ML IJ SOLN
30.0000 mg | Freq: Three times a day (TID) | INTRAMUSCULAR | Status: DC | PRN
  Administered 2011-12-21: 30 mg via INTRAVENOUS
  Filled 2011-12-21: qty 1

## 2011-12-21 MED ORDER — ONDANSETRON HCL 4 MG/2ML IJ SOLN
INTRAMUSCULAR | Status: DC | PRN
  Administered 2011-12-21: 4 mg via INTRAVENOUS

## 2011-12-21 MED ORDER — ROCURONIUM BROMIDE 100 MG/10ML IV SOLN
INTRAVENOUS | Status: DC | PRN
  Administered 2011-12-21: 35 mg via INTRAVENOUS

## 2011-12-21 MED ORDER — ONDANSETRON HCL 4 MG/2ML IJ SOLN
INTRAMUSCULAR | Status: AC
  Filled 2011-12-21: qty 2

## 2011-12-21 MED ORDER — HYDROMORPHONE HCL PF 1 MG/ML IJ SOLN
INTRAMUSCULAR | Status: DC | PRN
  Administered 2011-12-21: 1 mg via INTRAVENOUS

## 2011-12-21 MED ORDER — HYDROMORPHONE HCL PF 1 MG/ML IJ SOLN
INTRAMUSCULAR | Status: AC
  Administered 2011-12-21: 1 mg via INTRAVENOUS
  Filled 2011-12-21: qty 1

## 2011-12-21 MED ORDER — NALOXONE HCL 0.4 MG/ML IJ SOLN: 0.4000 mg | INTRAMUSCULAR | Status: DC | PRN

## 2011-12-21 MED ORDER — PROPOFOL 10 MG/ML IV EMUL
INTRAVENOUS | Status: AC
  Filled 2011-12-21: qty 20

## 2011-12-21 MED ORDER — DEXAMETHASONE SODIUM PHOSPHATE 10 MG/ML IJ SOLN
INTRAMUSCULAR | Status: AC
  Filled 2011-12-21: qty 1

## 2011-12-21 MED ORDER — HYDROMORPHONE HCL PF 1 MG/ML IJ SOLN
INTRAMUSCULAR | Status: AC
  Administered 2011-12-21: 0.5 mg via INTRAVENOUS
  Filled 2011-12-21: qty 1

## 2011-12-21 MED ORDER — HYDROMORPHONE HCL PF 1 MG/ML IJ SOLN: 0.2000 mg | INTRAMUSCULAR | Status: DC | PRN

## 2011-12-21 MED ORDER — GLYCOPYRROLATE 0.2 MG/ML IJ SOLN
INTRAMUSCULAR | Status: DC | PRN
  Administered 2011-12-21: 0.3 mg via INTRAVENOUS

## 2011-12-21 SURGICAL SUPPLY — 38 items
CANISTER SUCTION 2500CC (MISCELLANEOUS) ×2 IMPLANT
CELLS DAT CNTRL 66122 CELL SVR (MISCELLANEOUS) IMPLANT
CLOTH BEACON ORANGE TIMEOUT ST (SAFETY) ×2 IMPLANT
DECANTER SPIKE VIAL GLASS SM (MISCELLANEOUS) IMPLANT
DRSG COVADERM 4X10 (GAUZE/BANDAGES/DRESSINGS) ×1 IMPLANT
ELECT LIGASURE LONG (ELECTRODE) ×1 IMPLANT
GLOVE BIO SURGEON STRL SZ8 (GLOVE) ×2 IMPLANT
GLOVE SURG ORTHO 8.0 STRL STRW (GLOVE) ×2 IMPLANT
GOWN PREVENTION PLUS LG XLONG (DISPOSABLE) ×4 IMPLANT
HEMOSTAT SURGICEL 4X8 (HEMOSTASIS) ×1 IMPLANT
NDL HYPO 25X1 1.5 SAFETY (NEEDLE) IMPLANT
NEEDLE HYPO 25X1 1.5 SAFETY (NEEDLE) IMPLANT
NS IRRIG 1000ML POUR BTL (IV SOLUTION) ×2 IMPLANT
PACK ABDOMINAL GYN (CUSTOM PROCEDURE TRAY) ×2 IMPLANT
PAD OB MATERNITY 4.3X12.25 (PERSONAL CARE ITEMS) ×2 IMPLANT
PROTECTOR NERVE ULNAR (MISCELLANEOUS) ×2 IMPLANT
RETRACTOR WND ALEXIS 18 MED (MISCELLANEOUS) IMPLANT
RETRACTOR WND ALEXIS 25 LRG (MISCELLANEOUS) IMPLANT
RTRCTR WOUND ALEXIS 18CM MED (MISCELLANEOUS)
RTRCTR WOUND ALEXIS 25CM LRG (MISCELLANEOUS) ×2
SPONGE LAP 18X18 X RAY DECT (DISPOSABLE) ×3 IMPLANT
STAPLER VISISTAT 35W (STAPLE) ×2 IMPLANT
STRIP CLOSURE SKIN 1/4X4 (GAUZE/BANDAGES/DRESSINGS) ×2 IMPLANT
SUT CHROMIC 3 0 SH 27 (SUTURE) IMPLANT
SUT MNCRL 0 MO-4 VIOLET 18 CR (SUTURE) ×3 IMPLANT
SUT MNCRL 0 VIOLET 6X18 (SUTURE) ×1 IMPLANT
SUT MONOCRYL 0 6X18 (SUTURE) ×1
SUT MONOCRYL 0 MO 4 18  CR/8 (SUTURE) ×2
SUT PDS AB 0 CT1 27 (SUTURE) IMPLANT
SUT VIC AB 0 CT1 18XCR BRD8 (SUTURE) IMPLANT
SUT VIC AB 0 CT1 8-18 (SUTURE) ×2
SUT VIC AB 1 CTX 36 (SUTURE)
SUT VIC AB 1 CTX36XBRD ANBCTRL (SUTURE) IMPLANT
SUT VIC AB 2-0 CT1 (SUTURE) ×2 IMPLANT
SYR CONTROL 10ML LL (SYRINGE) IMPLANT
TOWEL OR 17X24 6PK STRL BLUE (TOWEL DISPOSABLE) ×4 IMPLANT
TRAY FOLEY CATH 14FR (SET/KITS/TRAYS/PACK) ×2 IMPLANT
WATER STERILE IRR 1000ML POUR (IV SOLUTION) ×1 IMPLANT

## 2011-12-21 NOTE — Progress Notes (Signed)
Patient ID: Candice Hughes, female   DOB: 27-Feb-1971, 41 y.o.   MRN: [id number] Pt very sleepy today with the pain medicine VSSAF UOP excellent Bandage dry  POD DC Dilaudid Toradol and change to prn meds Reviewed findings at OR with pt and her family DL

## 2011-12-21 NOTE — Anesthesia Preprocedure Evaluation (Signed)
Anesthesia Evaluation  Patient identified by MRN, date of birth, ID band Patient awake    Reviewed: Allergy & Precautions, H&P , Patient's Chart, lab work & pertinent test results, reviewed documented beta blocker date and time   Airway Mallampati: II TM Distance: >3 FB Neck ROM: full    Dental No notable dental hx.    Pulmonary  breath sounds clear to auscultation  Pulmonary exam normal       Cardiovascular hypertension, Pt. on medications Rhythm:regular Rate:Normal     Neuro/Psych    GI/Hepatic   Endo/Other    Renal/GU      Musculoskeletal   Abdominal   Peds  Hematology   Anesthesia Other Findings   Reproductive/Obstetrics                           Anesthesia Physical Anesthesia Plan  ASA: II  Anesthesia Plan: General   Post-op Pain Management:    Induction: Intravenous  Airway Management Planned: Oral ETT  Additional Equipment:   Intra-op Plan:   Post-operative Plan:   Informed Consent: I have reviewed the patients History and Physical, chart, labs and discussed the procedure including the risks, benefits and alternatives for the proposed anesthesia with the patient or authorized representative who has indicated his/her understanding and acceptance.   Dental Advisory Given and Dental advisory given  Plan Discussed with: CRNA and Surgeon  Anesthesia Plan Comments: (  Discussed  general anesthesia, including possible nausea, instrumentation of airway, sore throat,pulmonary aspiration, etc. I asked if the were any outstanding questions, or  concerns before we proceeded. )        Anesthesia Quick Evaluation  

## 2011-12-21 NOTE — Brief Op Note (Addendum)
12/21/2011  8:22 AM  PATIENT:  Candice Hughes  41 y.o. female  PRE-OPERATIVE DIAGNOSIS:  FIBROIDS, PELVIC PAIN, MENORRHAGIA, PREVIOUS MYOMECTOMY  POST-OPERATIVE DIAGNOSIS:  FIBROIDS, PELVIC PAIN, MENORRHAGIA, PREVIOUS Myomectomy  PROCEDURE:  Procedure(s) (LRB): HYSTERECTOMY ABDOMINAL (N/A) with LSO  SURGEON:  Surgeon(s) and Role:    * Turner Daniels, MD - Primary    * Juluis Mire, MD - Assisting  PHYSICIAN ASSISTANT:   ASSISTANTS: [place]   ANESTHESIA:   general  EBL:  Total I/O In: 1000 [I.V.:1000] Out: 475 [Urine:375; Blood:100]  BLOOD ADMINISTERED:none  DRAINS: Urinary Catheter (Foley)   LOCAL MEDICATIONS USED:  NONE  SPECIMEN:  Source of Specimen:  Uterus and left tube and ovary  DISPOSITION OF SPECIMEN:  PATHOLOGY  COUNTS:  YES  TOURNIQUET:  * No tourniquets in log *  DICTATION: .Other Dictation: Dictation Number [id number]  PLAN OF CARE: Admit to inpatient   PATIENT DISPOSITION:  PACU - hemodynamically stable.   Delay start of Pharmacological VTE agent (>24hrs) due to surgical blood loss or risk of bleeding: no

## 2011-12-21 NOTE — Anesthesia Postprocedure Evaluation (Signed)
Anesthesia Post Note  Patient: Candice Hughes  Procedure(s) Performed: Procedure(s) (LRB): HYSTERECTOMY ABDOMINAL (N/A)  Anesthesia type: General  Patient location: PACU  Post pain: Pain level controlled  Post assessment: Post-op Vital signs reviewed  Last Vitals:  Filed Vitals:   12/21/11 0900  BP: 131/85  Pulse: 77  Temp:   Resp: 16    Post vital signs: Reviewed  Level of consciousness: sedated  Complications: No apparent anesthesia complicationsfj

## 2011-12-21 NOTE — Op Note (Addendum)
Candice Hughes, NARCISO NO.:  [id number]  MEDICAL RECORD NO.:  [id number]  LOCATION:  [facility]                          FACILITY:  [facility]  PHYSICIAN:  Dineen Kid. Rana Snare, M.D.    DATE OF BIRTH:  1970-09-03  DATE OF PROCEDURE: DATE OF DISCHARGE:                              OPERATIVE REPORT   PREOPERATIVE DIAGNOSES:  14 week size fibroid uterus, history of myomectomy, pelvic pressure, pain, and menorrhagia.  POSTOP DIAGNOSIS:  14 week size fibroid uterus, history of myomectomy, pelvic pressure, pain, and menorrhagia plus pelvic adhesions.  PROCEDURE:  Abdominal Hysterectomy and left salpingo-oophorectomy with lysis of adhesions.  SURGEON:  Dineen Kid. Rana Snare, MD  ASSISTANT:  Juluis Mire, MD  ANESTHESIA:  General endotracheal.  INDICATIONS:  Candice Hughes is a 41 year old, nullipara black female with a history of multiple fibroids status post myomectomy years ago.  She has been having worsening problems with abnormal bleeding, heavy bleeding, pelvic pressure and 14 week size fibroid uterus.  She desires surgical intervention and presents for hysterectomy with possible removal of one or both tubes and ovaries.  Risks and benefits of procedure were discussed at length.  Informed consent was obtained.  FINDINGS:  At the time of surgery, 14 week size globular fibroid uterus, with small bowel and large bowel adhesions to the anterior posterior surface of the uterus, and adhesions to the left tubo-ovarian complex.  DESCRIPTION OF PROCEDURE:  After adequate analgesia, the patient was placed in the supine position.  She was sterilely prepped and draped. The bladder was sterilely drained with a Foley catheter.  Pfannenstiel skin incision was made through the previous Pfannenstiel skin incision was taken sharply to fascia which was incised transversely, extended superiorly inferiorly off the bellies of the rectus muscle were separated sharply in the midline.  Peritoneum was entered  sharply.  The small bowel adhesions to the fundus into the posterior wall of the uterus were then sharply dissected using Metzenbaum scissors and care taken to avoid injury to the bowel.  After the bowel had been dissected from the surface of the uterus, LigaSure instrument was used to ligate across the left round ligament, and left utero-ovarian ligament, dissecting the uterus from the left tubo-ovarian complex.  The right round ligament and right utero-ovarian ligaments were ligated with suture ligature and dissected in a similar fashion.  The bladder was then dissected off the anterior surface of the uterus and cervix. LigaSure instrument was used to ligate along the inferior portion of the broad ligament down to the uterine vasculature down the cardinal ligaments, down to the edge of the uterosacral ligaments.  The large bowel was on the posterior wall of the uterus.  It was dissected sharply off the uterosacral ligaments with good hemostasis and care taken to avoid any bowel injury.  A Haney clamp was placed across the uterosacral ligaments bilaterally.  It was dissected and it was ligated with 0 Monocryl suture.  The vagina was then entered and the cervix and uterus were removed intact.  The vagina was then closed with interrupted sutures of 0 Monocryl suture.  Good approximation good hemostasis noted. The uterosacral ligaments were then plicated in midline with good support of  the vagina noted.  Irrigation was applied and the small bleeders along the peritoneal edge were Bovie cauterized.  The ureter was identified bilaterally and well away from the suture line and good peristalsis was noted.  The left tube and ovary were adhered to the small bowel.  This was sharply dissected from the small bowel, and due to the bleeding along the ovary and fallopian tube, the decision was made to remove the left tube and ovary.  LigaSure instrument was placed across the infundibulopelvic  ligament, with good hemostasis achieved, and the left tube and ovary were removed.  Care was taken to avoid the ureter which was well away from the dissection after copious amount of irrigation, adequate hemostasis was assured.  Reexamination revealed normal-appearing right ovary which was then tacked to the round ligament with 0 Monocryl suture.  Both ureters were identified and good peristalsis was noted.  The bowel was re-examined as was the peritoneum and no other bleeders or injury were noted.  A small piece of Surgicel was placed in the cul-de-sac over the denuded area that had been removed during dissection with good hemostasis achieved.  The packing was removed.  The peritoneum was then closed with 0 Monocryl suture. Muscles plicated in the midline.  Fascia was then closed with #1 Vicryl in a running fashion.  Irrigation applied after adequate hemostasis, the skin was stapled,  Steri-Strips applied.  The patient tolerated the procedure well was stable and transferred to recovery room.  Sponge and instrument count was normal x3.  ESTIMATED BLOOD LOSS:  100 mL.  The patient received 1 g of cefotetan preoperatively.     Dineen Kid Rana Snare, M.D.     DCL/MEDQ  D:  12/21/2011  T:  12/21/2011  Job:  [id number]

## 2011-12-21 NOTE — Transfer of Care (Signed)
Immediate Anesthesia Transfer of Care Note  Patient: Candice Hughes  Procedure(s) Performed: Procedure(s) (LRB): HYSTERECTOMY ABDOMINAL (N/A)  Patient Location: PACU  Anesthesia Type: General  Level of Consciousness: awake, alert  and oriented  Airway & Oxygen Therapy: Patient Spontanous Breathing and Patient connected to nasal cannula oxygen  Post-op Assessment: Report given to PACU RN and Post -op Vital signs reviewed and stable  Post vital signs: Reviewed and stable  Complications: No apparent anesthesia complications

## 2011-12-22 ENCOUNTER — Encounter (HOSPITAL_COMMUNITY): Payer: Self-pay | Admitting: Obstetrics and Gynecology

## 2011-12-22 LAB — CBC
HCT: 35.2 % — ABNORMAL LOW (ref 36.0–46.0)
Hemoglobin: 12.5 g/dL (ref 12.0–15.0)
MCHC: 35.5 g/dL (ref 30.0–36.0)
RBC: 4.22 MIL/uL (ref 3.87–5.11)

## 2011-12-22 LAB — GLUCOSE, CAPILLARY: Glucose-Capillary: 126 mg/dL — ABNORMAL HIGH (ref 70–99)

## 2011-12-22 NOTE — Addendum Note (Signed)
Addendum  created 12/22/11 1028 by Suella Grove, CRNA   Modules edited:Notes Section

## 2011-12-22 NOTE — Progress Notes (Signed)
1 Day Post-Op Procedure(s) (LRB): HYSTERECTOMY ABDOMINAL (N/A)  Subjective: Patient reports nausea, tolerating PO, + flatus and no problems voiding.    Objective: I have reviewed patient's vital signs, intake and output, medications and labs.  General: alert, cooperative and mild distress Abs soft nt BS+ Bandage dry Assessment: s/p Procedure(s) (LRB): HYSTERECTOMY ABDOMINAL (N/A): stable and tolerating diet  Plan: Advance diet Encourage ambulation Advance to PO medication  LOS: 1 day    Candice Hughes C 12/22/2011, 9:35 AM

## 2011-12-22 NOTE — Progress Notes (Signed)
UR Chart review completed.  

## 2011-12-22 NOTE — Anesthesia Postprocedure Evaluation (Signed)
  Anesthesia Post-op Note  Patient: Candice Hughes  Procedure(s) Performed: Procedure(s) (LRB): HYSTERECTOMY ABDOMINAL (N/A)  Patient Location: [facility]  Anesthesia Type: General  Level of Consciousness: awake  Airway and Oxygen Therapy: Patient Spontanous Breathing  Post-op Pain: mild  Post-op Assessment: Patient's Cardiovascular Status Stable, Respiratory Function Stable, RESPIRATORY FUNCTION UNSTABLE, No signs of Nausea or vomiting, Adequate PO intake and Pain level controlled  Post-op Vital Signs: Reviewed and stable  Complications: No apparent anesthesia complications

## 2011-12-23 MED ORDER — IBUPROFEN 600 MG PO TABS: 600.0000 mg | ORAL_TABLET | Freq: Four times a day (QID) | ORAL | Status: DC | PRN

## 2011-12-23 MED ORDER — OXYCODONE-ACETAMINOPHEN 5-325 MG PO TABS: 1.0000 | ORAL_TABLET | ORAL | Status: AC | PRN

## 2011-12-23 NOTE — Progress Notes (Signed)
Pt. Is discharged in the ar of family. Downstairs per ambulatory. Stable. Denies pain or discomfort. Abdominal steri strip are intact on incisional site. Denies excessive vaginal bleeding. Aware of all discharge instructions, and understood them well Questions asked and answered.

## 2011-12-23 NOTE — Discharge Summary (Signed)
Physician Discharge Summary  Patient ID: Candice Hughes MRN: [id number] DOB/AGE: 11-23-1970 41 y.o.  Admit date: 12/21/2011 Discharge date: 12/23/2011  Admission Diagnoses:Pelvic pain, fibroids, AUB  Discharge Diagnoses: same Active Problems:  * No active hospital problems. *    Discharged Condition: good  Hospital Course: Pt underwent uncomplicated TAH with LSO for fibroids and pelvic adhesions.   Her postop course was unremarkable with good return of bowel function and by POD 1 was tolerating regular diet , passing flatus and by Day 2 desired d/c home.  Staples will be removed and steri strips placed  Consults: None  Significant Diagnostic Studies:   Treatments: IV hydration and procedures: TAH/LSO  Discharge Exam: Blood pressure 132/89, pulse 93, temperature 98.4 F (36.9 C), temperature source Oral, resp. rate 18, height 5' 3.5" (1.613 m), weight 68.04 kg (150 lb), SpO2 98.00%. General appearance: alert, cooperative, appears stated age and no distress Incsion CD&I  Disposition:   Discharge Orders    Future Orders Please Complete By Expires   Diet general      Increase activity slowly      Call MD for:  temperature >100.4      Call MD for:  persistant nausea and vomiting      Call MD for:  severe uncontrolled pain      Call MD for:  redness, tenderness, or signs of infection (pain, swelling, redness, odor or green/yellow discharge around incision site)      Call MD for:  difficulty breathing, headache or visual disturbances      Remove staples      Apply steri strips        Medication List  As of 12/23/2011 10:17 AM   STOP taking these medications         LO LOESTRIN FE 1 MG-10 MCG / 10 MCG tablet         TAKE these medications         ibuprofen 600 MG tablet   Commonly known as: ADVIL,MOTRIN   Take 1 tablet (600 mg total) by mouth every 6 (six) hours as needed (mild pain).      levocetirizine 5 MG tablet   Commonly known as: XYZAL   Take 5 mg by mouth  every evening.      losartan-hydrochlorothiazide 100-12.5 MG per tablet   Commonly known as: HYZAAR   Take 1 tablet by mouth daily.      naproxen sodium 220 MG tablet   Commonly known as: ANAPROX   Take 220-440 mg by mouth 2 (two) times daily as needed. For pain      OVER THE COUNTER MEDICATION   Take 1 tablet by mouth daily. Slim Trim U 250 mg tablet      oxyCODONE-acetaminophen 5-325 MG per tablet   Commonly known as: PERCOCET   Take 1-2 tablets by mouth every 4 (four) hours as needed (moderate to severe pain (when tolerating fluids)).      Vitamin D3 1000 UNITS Caps   Take 1 capsule by mouth daily.             Signed: Dennice Tindol C 12/23/2011, 10:17 AM

## 2011-12-23 NOTE — Progress Notes (Signed)
2 Days Post-Op Procedure(s) (LRB): HYSTERECTOMY ABDOMINAL (N/A)  Subjective: Patient reports tolerating PO, + flatus, + BM and no problems voiding.    Objective: I have reviewed patient's vital signs, intake and output, medications and labs.  General: alert, cooperative and no distress incision is CD & I  Assessment: s/p Procedure(s) (LRB): HYSTERECTOMY ABDOMINAL (N/A): stable  Plan: Discharge home  LOS: 2 days    Candice Hughes C 12/23/2011, 10:12 AM

## 2013-01-19 ENCOUNTER — Telehealth: Payer: Self-pay | Admitting: Hematology and Oncology

## 2013-01-19 NOTE — Telephone Encounter (Signed)
LVOM FOR PT TO RETURN CALL IN RE NP APPT.  °

## 2013-01-20 ENCOUNTER — Telehealth: Payer: Self-pay | Admitting: Hematology and Oncology

## 2013-01-23 ENCOUNTER — Telehealth: Payer: Self-pay

## 2013-01-23 NOTE — Telephone Encounter (Signed)
C/D 01/23/13 for appt. 02/09/13

## 2013-02-09 ENCOUNTER — Ambulatory Visit: Payer: BC Managed Care – PPO

## 2013-02-09 ENCOUNTER — Other Ambulatory Visit: Payer: BC Managed Care – PPO | Admitting: Lab

## 2013-02-09 ENCOUNTER — Encounter: Payer: Self-pay | Admitting: *Deleted

## 2013-02-09 ENCOUNTER — Telehealth: Payer: Self-pay | Admitting: Hematology and Oncology

## 2013-02-09 ENCOUNTER — Ambulatory Visit (HOSPITAL_BASED_OUTPATIENT_CLINIC_OR_DEPARTMENT_OTHER): Payer: BC Managed Care – PPO | Admitting: Hematology and Oncology

## 2013-02-09 ENCOUNTER — Ambulatory Visit (HOSPITAL_BASED_OUTPATIENT_CLINIC_OR_DEPARTMENT_OTHER): Payer: BC Managed Care – PPO | Admitting: Lab

## 2013-02-09 VITALS — BP 141/79 | HR 89 | Temp 99.8°F | Resp 18 | Ht 62.0 in | Wt 152.2 lb

## 2013-02-09 DIAGNOSIS — Z87898 Personal history of other specified conditions: Secondary | ICD-10-CM

## 2013-02-09 DIAGNOSIS — Z8572 Personal history of non-Hodgkin lymphomas: Secondary | ICD-10-CM

## 2013-02-09 LAB — CBC WITH DIFFERENTIAL/PLATELET
BASO%: 1.2 % (ref 0.0–2.0)
Eosinophils Absolute: 0 10*3/uL (ref 0.0–0.5)
LYMPH%: 28.8 % (ref 14.0–49.7)
MCHC: 34.7 g/dL (ref 31.5–36.0)
MONO#: 0.4 10*3/uL (ref 0.1–0.9)
NEUT#: 3.4 10*3/uL (ref 1.5–6.5)
Platelets: 294 10*3/uL (ref 145–400)
RBC: 4.38 10*6/uL (ref 3.70–5.45)
RDW: 12.5 % (ref 11.2–14.5)
WBC: 5.4 10*3/uL (ref 3.9–10.3)
lymph#: 1.6 10*3/uL (ref 0.9–3.3)

## 2013-02-09 NOTE — Progress Notes (Signed)
Faxed Signed Release of Information to [facility] At fax #[phone] for records, per Dr. Haynes Bast request.

## 2013-02-09 NOTE — Telephone Encounter (Signed)
gave pt appt for lab and MD for August 2015, pt sent to labs today

## 2013-02-12 NOTE — Progress Notes (Signed)
ID: Candice Hughes OB: 1971/05/08  MR#: [id number]  NWG#:[id number]  [facility]  [phone] [phone]     INITIAL HEMATOLOGY CONSULTATION    Referral MD:  Alva Garnet., MD   Reason for Referral: History of non hodgkin's lymphoma.  HISTORY OF PRESENT ILLNESS: The patient is a 42 y.o. Female who presented to clinic with history of NHL. She wants to have annual follow up here. She reported that in 2005 she started to cough a lot , developed dyspnea and became lethargic. She was sen by pulmonologist who found cervical lymphadenopathy and send patient for biopsy which revealed NHL. Ct scan was done which was positive for mediastinal involvement. The patient went to [facility] where she was treated with six cycles of R-EPOCH. After that she was in remission. She had follow up with [facility] recently once a year. Today she reported doing well. Her appetite is good and weight is stable. She is currently on birth control pills. The patient denied fever, chills, night sweats, change in appetite or weight. She denied headaches, double vision, blurry vision, nasal congestion, nasal discharge, hearing problems, odynophagia or dysphagia. No chest pain, palpitations, dyspnea, cough, abdominal pain, nausea, vomiting, diarrhea, constipation, hematochezia. The patient denied dysuria, nocturia, polyuria, hematuria, myalgia, numbness, tingling, psychiatric problems. . Review of Systems  Constitutional: Negative for fever, chills, weight loss, malaise/fatigue and diaphoresis.  HENT: Negative for hearing loss, ear pain, nosebleeds, congestion, sore throat, neck pain, tinnitus and ear discharge.   Eyes: Negative for blurred vision, double vision, photophobia and pain.  Respiratory: Negative for cough, hemoptysis, sputum production, shortness of breath, wheezing and stridor.   Cardiovascular: Negative for chest pain, palpitations, orthopnea, claudication, leg swelling and PND.   Gastrointestinal: Negative for heartburn, nausea, vomiting, abdominal pain, diarrhea, constipation, blood in stool and melena.  Genitourinary: Negative for dysuria, urgency, frequency, hematuria and flank pain.  Musculoskeletal: Negative for myalgias, back pain, joint pain and falls.  Skin: Negative for itching and rash.  Neurological: Negative for dizziness, tingling, tremors, sensory change, speech change, focal weakness, seizures, loss of consciousness, weakness and headaches.  Endo/Heme/Allergies: Does not bruise/bleed easily.  Psychiatric/Behavioral: Negative for depression, suicidal ideas, hallucinations, memory loss and substance abuse. The patient is not nervous/anxious and does not have insomnia.    PAST MEDICAL HISTORY: Past Medical History  Diagnosis Date  . Hypertension   . Cancer 2005    history of lymphoma-chest tumor  . Headache   . History of blood transfusion 2005    surgery related- [place]    PAST SURGICAL HISTORY: Past Surgical History  Procedure Laterality Date  . Myomectomy  2000  . Dilation and curettage of uterus  2005    x2  . Chest wall biopsy  2005  . Abdominal hysterectomy  12/21/2011    Procedure: HYSTERECTOMY ABDOMINAL;  Surgeon: Turner Daniels, MD;  Location: [facility];  Service: Gynecology;  Laterality: N/A;  left Oophorectomy    FAMILY HISTORY No family history on file.  Father: HTN, brain cancer Mother: O.K. 1 Brother and 1 sister are doing well.   HEALTH MAINTENANCE: History  Substance Use Topics  . Smoking status: Never Smoker   . Smokeless tobacco: Not on file  . Alcohol Use: No    Allergies  Allergen Reactions  . Iohexol      Code: HIVES, Desc: hives, itching, itchy throat, premed w/ benadryl prior to scan, (done w/ 50mg  just prior to scan 05/24/08 w/ no probs dr hall approved)  Current Outpatient Prescriptions  Medication Sig Dispense Refill  . Cholecalciferol (VITAMIN D3) 1000 UNITS CAPS Take 2 capsules by mouth daily.        Marland Kitchen ibuprofen (ADVIL,MOTRIN) 600 MG tablet Take 1 tablet (600 mg total) by mouth every 6 (six) hours as needed (mild pain).  30 tablet  0  . levocetirizine (XYZAL) 5 MG tablet Take 5 mg by mouth every evening.      Marland Kitchen losartan (COZAAR) 100 MG tablet Take 100 mg by mouth daily.      . norethindrone-ethinyl estradiol (JUNEL FE,GILDESS FE,LOESTRIN FE) 1-20 MG-MCG tablet Take 1 tablet by mouth daily.      Marland Kitchen spironolactone (ALDACTONE) 25 MG tablet Take 25 mg by mouth daily.      . vitamin C (ASCORBIC ACID) 500 MG tablet Take 500 mg by mouth 2 (two) times daily.       No current facility-administered medications for this visit.    OBJECTIVE: Filed Vitals:   02/09/13 1044  BP: 141/79  Pulse: 89  Temp: 99.8 F (37.7 C)  Resp: 18     Body mass index is 27.83 kg/(m^2).    ECOG FS: 0 HEENT: Sclerae anicteric.  Conjunctivae were pink. Pupils round and reactive bilaterally. Oral mucosa is moist without ulceration or thrush. No occipital, submandibular, cervical, supraclavicular or axillar adenopathy. Lungs: clear to auscultation without wheezes. No rales or rhonchi. Heart: regular rate and rhythm. No murmur, gallop or rubs. Abdomen: soft, non tender. No guarding or rebound tenderness. Bowel sounds are present. No palpable hepatosplenomegaly. MSK: no focal spinal tenderness. Extremities: No clubbing or cyanosis.No calf tenderness to palpitation, no peripheral edema. The patient had grossly intact strength in upper and lower extremities Neuro: non-focal, alert and oriented to time, person and place, appropriate affect  LAB RESULTS:  CMP     Component Value Date/Time   NA 135 12/15/2011 1343   K 3.4* 12/15/2011 1343   CL 97 12/15/2011 1343   CO2 29 12/15/2011 1343   GLUCOSE 93 12/15/2011 1343   BUN 15 12/15/2011 1343   CREATININE 0.75 12/15/2011 1343   CALCIUM 9.9 12/15/2011 1343   PROT 7.4 05/07/2006 1541   ALBUMIN 4.4 05/07/2006 1541   AST 18 05/07/2006 1541   ALT 21 05/07/2006 1541   ALKPHOS 63  05/07/2006 1541   BILITOT 0.2* 05/07/2006 1541   GFRNONAA >90 12/15/2011 1343   GFRAA >90 12/15/2011 1343    Lab Results  Component Value Date   WBC 5.4 02/09/2013   NEUTROABS 3.4 02/09/2013   HGB 13.2 02/09/2013   HCT 38.1 02/09/2013   MCV 87.0 02/09/2013   PLT 294 02/09/2013      Chemistry      Component Value Date/Time   NA 135 12/15/2011 1343   K 3.4* 12/15/2011 1343   CL 97 12/15/2011 1343   CO2 29 12/15/2011 1343   BUN 15 12/15/2011 1343   CREATININE 0.75 12/15/2011 1343      Component Value Date/Time   CALCIUM 9.9 12/15/2011 1343   ALKPHOS 63 05/07/2006 1541   AST 18 05/07/2006 1541   ALT 21 05/07/2006 1541   BILITOT 0.2* 05/07/2006 1541       STUDIES: No results found.  ASSESSMENT:  1. History of NHL, diagnosed in 2005 S/p treatment with R-EPOCH (according to patient) In remission. PLAN: 1. We will request medical record from [facility]. 2. No evidence of recurrence of NHL now. 3. Follow up in 1 year if continue to be asymptomatic. Patient will  call if she develops any problem.   Myra Rude, MD   02/12/2013 10:39 PM

## 2013-06-08 ENCOUNTER — Other Ambulatory Visit: Payer: Self-pay | Admitting: Sports Medicine

## 2013-06-08 DIAGNOSIS — M25561 Pain in right knee: Secondary | ICD-10-CM

## 2013-06-10 ENCOUNTER — Other Ambulatory Visit: Payer: BC Managed Care – PPO

## 2014-01-27 ENCOUNTER — Telehealth: Payer: Self-pay | Admitting: Hematology and Oncology

## 2014-01-27 NOTE — Telephone Encounter (Signed)
lvm for pt regarding to Aug 14 appt moved to 8.24 per MD on pal....mailed pt appt sched and letter

## 2014-02-07 ENCOUNTER — Telehealth: Payer: Self-pay | Admitting: Hematology and Oncology

## 2014-02-07 NOTE — Telephone Encounter (Signed)
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

## 2014-02-09 ENCOUNTER — Other Ambulatory Visit: Payer: BC Managed Care – PPO

## 2014-02-09 ENCOUNTER — Ambulatory Visit: Payer: BC Managed Care – PPO | Admitting: Hematology and Oncology

## 2014-02-19 ENCOUNTER — Ambulatory Visit: Payer: BC Managed Care – PPO | Admitting: Hematology and Oncology

## 2014-02-19 ENCOUNTER — Other Ambulatory Visit: Payer: BC Managed Care – PPO

## 2014-03-12 ENCOUNTER — Other Ambulatory Visit: Payer: Self-pay | Admitting: Hematology and Oncology

## 2014-03-12 ENCOUNTER — Other Ambulatory Visit: Payer: BC Managed Care – PPO

## 2014-03-12 ENCOUNTER — Ambulatory Visit: Payer: BC Managed Care – PPO | Admitting: Hematology and Oncology

## 2014-03-12 DIAGNOSIS — Z8572 Personal history of non-Hodgkin lymphomas: Secondary | ICD-10-CM

## 2014-03-15 ENCOUNTER — Encounter: Payer: Self-pay | Admitting: Hematology and Oncology

## 2014-03-15 ENCOUNTER — Ambulatory Visit (HOSPITAL_BASED_OUTPATIENT_CLINIC_OR_DEPARTMENT_OTHER): Payer: BC Managed Care – PPO | Admitting: Hematology and Oncology

## 2014-03-15 ENCOUNTER — Other Ambulatory Visit (HOSPITAL_BASED_OUTPATIENT_CLINIC_OR_DEPARTMENT_OTHER): Payer: BC Managed Care – PPO

## 2014-03-15 VITALS — BP 133/90 | HR 89 | Temp 100.1°F | Resp 19 | Ht 62.0 in | Wt 154.1 lb

## 2014-03-15 DIAGNOSIS — R0989 Other specified symptoms and signs involving the circulatory and respiratory systems: Secondary | ICD-10-CM

## 2014-03-15 DIAGNOSIS — Z87898 Personal history of other specified conditions: Secondary | ICD-10-CM

## 2014-03-15 DIAGNOSIS — L708 Other acne: Secondary | ICD-10-CM

## 2014-03-15 DIAGNOSIS — L709 Acne, unspecified: Secondary | ICD-10-CM

## 2014-03-15 DIAGNOSIS — Z8572 Personal history of non-Hodgkin lymphomas: Secondary | ICD-10-CM

## 2014-03-15 HISTORY — DX: Acne, unspecified: L70.9

## 2014-03-15 LAB — CBC WITH DIFFERENTIAL/PLATELET
BASO%: 0.4 % (ref 0.0–2.0)
BASOS ABS: 0 10*3/uL (ref 0.0–0.1)
EOS%: 0.7 % (ref 0.0–7.0)
Eosinophils Absolute: 0 10*3/uL (ref 0.0–0.5)
HCT: 37 % (ref 34.8–46.6)
HEMOGLOBIN: 13 g/dL (ref 11.6–15.9)
LYMPH%: 32.5 % (ref 14.0–49.7)
MCH: 30.2 pg (ref 25.1–34.0)
MCHC: 35.1 g/dL (ref 31.5–36.0)
MCV: 85.8 fL (ref 79.5–101.0)
MONO#: 0.4 10*3/uL (ref 0.1–0.9)
MONO%: 7.4 % (ref 0.0–14.0)
NEUT#: 3.4 10*3/uL (ref 1.5–6.5)
NEUT%: 59 % (ref 38.4–76.8)
Platelets: 306 10*3/uL (ref 145–400)
RBC: 4.31 10*6/uL (ref 3.70–5.45)
RDW: 12 % (ref 11.2–14.5)
WBC: 5.7 10*3/uL (ref 3.9–10.3)
lymph#: 1.8 10*3/uL (ref 0.9–3.3)

## 2014-03-15 LAB — COMPREHENSIVE METABOLIC PANEL (CC13)
ALBUMIN: 3.6 g/dL (ref 3.5–5.0)
ALT: 13 U/L (ref 0–55)
AST: 17 U/L (ref 5–34)
Alkaline Phosphatase: 42 U/L (ref 40–150)
Anion Gap: 9 mEq/L (ref 3–11)
BUN: 13.1 mg/dL (ref 7.0–26.0)
CALCIUM: 9.7 mg/dL (ref 8.4–10.4)
CHLORIDE: 102 meq/L (ref 98–109)
CO2: 26 mEq/L (ref 22–29)
CREATININE: 0.8 mg/dL (ref 0.6–1.1)
Glucose: 101 mg/dl (ref 70–140)
Potassium: 3.9 mEq/L (ref 3.5–5.1)
Sodium: 137 mEq/L (ref 136–145)
Total Bilirubin: 0.29 mg/dL (ref 0.20–1.20)
Total Protein: 7.5 g/dL (ref 6.4–8.3)

## 2014-03-15 LAB — LACTATE DEHYDROGENASE (CC13): LDH: 158 U/L (ref 125–245)

## 2014-03-15 MED ORDER — MINOCYCLINE HCL 100 MG PO CAPS: 100.0000 mg | ORAL_CAPSULE | Freq: Every day | ORAL | Status: DC

## 2014-03-15 NOTE — Progress Notes (Signed)
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 female was transferred to my care after her prior physician has left.  I reviewed the patient's records extensive and collaborated the history with the patient. Summary of her history is as follows: She reported that in 2005 she started to cough a lot, developed dyspnea and became lethargic. She was sen by pulmonologist who found cervical lymphadenopathy and send patient for biopsy which revealed NHL. Ct scan was done which was positive for mediastinal involvement. The patient went [facility] where she was treated with six cycles of R-EPOCH. After that she was in remission.  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 She also has history of premature menopause. She had a complete hysterectomy with unilateral salpingo-oophorectomy for uterine fibroids. She takes birth control pill for acne. Recently, she felt that acne is slightly worse. She also complained of recent sore throat and upper respiratory tract congestion, slightly improved.  MEDICAL HISTORY:  Past Medical History  Diagnosis Date  . Hypertension   . Cancer 2005    history of lymphoma-chest tumor  . Headache(784.0)   . History of blood transfusion 2005    surgery related- [place]  . Acne 03/15/2014    SURGICAL HISTORY: Past Surgical History  Procedure Laterality Date  . Myomectomy  2000  . Dilation and curettage of uterus  2005    x2  . Chest wall biopsy  2005  . Abdominal hysterectomy  12/21/2011    Procedure: HYSTERECTOMY ABDOMINAL;  Surgeon: Luz Lex, MD;  Location: [facility];  Service: Gynecology;  Laterality: N/A;  left Oophorectomy    SOCIAL  HISTORY: History   Social History  . Marital Status: Married    Spouse Name: N/A    Number of Children: N/A  . Years of Education: N/A   Occupational History  . Not on file.   Social History Main Topics  . Smoking status: Never Smoker   . Smokeless tobacco: Never Used  . Alcohol Use: No  . Drug Use: No  . Sexual Activity: Not on file   Other Topics Concern  . Not on file   Social History Narrative  . No narrative on file    FAMILY HISTORY: History reviewed. No pertinent family history.  ALLERGIES:  is allergic to iohexol.  MEDICATIONS:  Current Outpatient Prescriptions  Medication Sig Dispense Refill  . cetirizine (ZYRTEC) 5 MG tablet Take 5 mg by mouth daily.      . Cholecalciferol (VITAMIN D-3) 5000 UNITS TABS Take by mouth daily.      Marland Kitchen ibuprofen (ADVIL,MOTRIN) 600 MG tablet Take 1 tablet (600 mg total) by mouth every 6 (six) hours as needed (mild pain).  30 tablet  0  . losartan (COZAAR) 100 MG tablet Take 100 mg by mouth daily.      . Norethin Ace-Eth Estrad-FE (MINASTRIN 24 FE PO) Take by mouth daily.      . minocycline (MINOCIN) 100 MG capsule Take 1 capsule (100 mg total) by mouth daily.  90 capsule  3   No current facility-administered medications for this visit.    REVIEW OF SYSTEMS:   Constitutional: Denies fevers, chills or abnormal night sweats Eyes: Denies blurriness of vision, double vision  or watery eyes Respiratory: Denies cough, dyspnea or wheezes Cardiovascular: Denies palpitation, chest discomfort or lower extremity swelling Gastrointestinal:  Denies nausea, heartburn or change in bowel habits Lymphatics: Denies new lymphadenopathy or easy bruising Neurological:Denies numbness, tingling or new weaknesses Behavioral/Psych: Mood is stable, no new changes  All other systems were reviewed with the patient and are negative.  PHYSICAL EXAMINATION: ECOG PERFORMANCE STATUS: 1 - Symptomatic but completely ambulatory  Filed Vitals:   03/15/14 1553   BP: 133/90  Pulse: 89  Temp: 100.1 F (37.8 C)  Resp: 19   Filed Weights   03/15/14 1553  Weight: 154 lb 1.6 oz (69.899 kg)    GENERAL:alert, no distress and comfortable SKIN: skin color, texture, turgor are normal, no rashes or significant lesions. Mild facial acne EYES: normal, conjunctiva are pink and non-injected, sclera clear OROPHARYNX:no exudate, normal lips, buccal mucosa, and tongue  NECK: supple, thyroid normal size, non-tender, without nodularity LYMPH:  no palpable lymphadenopathy in the cervical, axillary or inguinal LUNGS: clear to auscultation and percussion with normal breathing effort HEART: regular rate & rhythm and no murmurs without lower extremity edema ABDOMEN:abdomen soft, non-tender and normal bowel sounds Musculoskeletal:no cyanosis of digits and no clubbing  PSYCH: alert & oriented x 3 with fluent speech NEURO: no focal motor/sensory deficits  LABORATORY DATA:  I have reviewed the data as listed Lab Results  Component Value Date   WBC 5.7 03/15/2014   HGB 13.0 03/15/2014   HCT 37.0 03/15/2014   MCV 85.8 03/15/2014   PLT 306 03/15/2014    Recent Labs  03/15/14 1536  NA 137  K 3.9  CO2 26  GLUCOSE 101  BUN 13.1  CREATININE 0.8  CALCIUM 9.7  PROT 7.5  ALBUMIN 3.6  AST 17  ALT 13  ALKPHOS 42  BILITOT 0.29    ASSESSMENT & PLAN:  H/O non-Hodgkin's lymphoma Clinically, she has no signs of disease recurrence. She is currently a 10 years cancer survivor. I will discharge her from the clinic. We discussed about the importance of vaccination program and preventive care. The patient is up-to-date with age appropriate screening programs.  Acne Recommend discontinuation of birth control pill, especially with increased risk of blood clots and breast cancer. I recommend a trial of minocycline. We discussed the risks, benefits, side effects of antibiotic therapy and I gave her patient education hand out.  Upper respiratory symptom I suspect she  has symptoms of allergy. I do not believe she needs any prescription for this.    No orders of the defined types were placed in this encounter.    All questions were answered. The patient knows to call the clinic with any problems, questions or concerns. I spent 25 minutes counseling the patient face to face. The total time spent in the appointment was 30 minutes and more than 50% was on counseling.     [facility], [place], MD 03/15/2014 8:39 PM

## 2014-03-15 NOTE — Assessment & Plan Note (Signed)
Clinically, she has no signs of disease recurrence. She is currently a 10 years cancer survivor. I will discharge her from the clinic. We discussed about the importance of vaccination program and preventive care. The patient is up-to-date with age appropriate screening programs.

## 2014-03-15 NOTE — Assessment & Plan Note (Signed)
I suspect she has symptoms of allergy. I do not believe she needs any prescription for this.

## 2014-03-15 NOTE — Assessment & Plan Note (Signed)
Recommend discontinuation of birth control pill, especially with increased risk of blood clots and breast cancer. I recommend a trial of minocycline. We discussed the risks, benefits, side effects of antibiotic therapy and I gave her patient education hand out.

## 2014-03-22 ENCOUNTER — Encounter: Payer: Self-pay | Admitting: *Deleted

## 2014-03-22 NOTE — Progress Notes (Signed)
Faxed lab results and office note to [facility] per Dr. Calton Dach request.  Fax [phone].

## 2017-07-30 ENCOUNTER — Telehealth: Payer: Self-pay | Admitting: *Deleted

## 2017-07-30 NOTE — Telephone Encounter (Signed)
Faxed records to Dr. Baird Cancer release ID [id number].

## 2018-03-17 DIAGNOSIS — Z0000 Encounter for general adult medical examination without abnormal findings: Secondary | ICD-10-CM | POA: Diagnosis not present

## 2018-03-17 DIAGNOSIS — I10 Essential (primary) hypertension: Secondary | ICD-10-CM

## 2018-03-17 DIAGNOSIS — Z23 Encounter for immunization: Secondary | ICD-10-CM

## 2018-03-17 DIAGNOSIS — F4321 Adjustment disorder with depressed mood: Secondary | ICD-10-CM | POA: Diagnosis not present

## 2018-04-08 ENCOUNTER — Other Ambulatory Visit: Payer: Self-pay | Admitting: Internal Medicine

## 2018-04-21 ENCOUNTER — Other Ambulatory Visit: Payer: Self-pay | Admitting: Internal Medicine

## 2018-04-24 ENCOUNTER — Other Ambulatory Visit: Payer: Self-pay | Admitting: Internal Medicine

## 2018-05-12 ENCOUNTER — Ambulatory Visit: Payer: BC Managed Care – PPO | Admitting: Internal Medicine

## 2018-05-12 ENCOUNTER — Encounter: Payer: Self-pay | Admitting: Internal Medicine

## 2018-05-12 VITALS — BP 128/92 | HR 88 | Temp 98.4°F | Ht 62.0 in | Wt 148.2 lb

## 2018-05-12 DIAGNOSIS — E7800 Pure hypercholesterolemia, unspecified: Secondary | ICD-10-CM

## 2018-05-12 DIAGNOSIS — I10 Essential (primary) hypertension: Secondary | ICD-10-CM | POA: Diagnosis not present

## 2018-05-12 DIAGNOSIS — Z79899 Other long term (current) drug therapy: Secondary | ICD-10-CM | POA: Diagnosis not present

## 2018-05-12 DIAGNOSIS — J3089 Other allergic rhinitis: Secondary | ICD-10-CM

## 2018-05-12 MED ORDER — MONTELUKAST SODIUM 10 MG PO TABS: 10.0000 mg | ORAL_TABLET | Freq: Every day | ORAL | 5 refills | Status: DC

## 2018-05-12 NOTE — Patient Instructions (Signed)

## 2018-05-13 LAB — HEPATIC FUNCTION PANEL
ALT: 22 IU/L (ref 0–32)
AST: 19 IU/L (ref 0–40)
Albumin: 4.7 g/dL (ref 3.5–5.5)
Alkaline Phosphatase: 81 IU/L (ref 39–117)
BILIRUBIN, DIRECT: 0.08 mg/dL (ref 0.00–0.40)
TOTAL PROTEIN: 6.8 g/dL (ref 6.0–8.5)

## 2018-05-13 LAB — LIPID PANEL
CHOL/HDL RATIO: 2.6 ratio (ref 0.0–4.4)
Cholesterol, Total: 172 mg/dL (ref 100–199)
HDL: 65 mg/dL (ref >39)
LDL CALC: 91 mg/dL (ref 0–99)
TRIGLYCERIDES: 79 mg/dL (ref 0–149)
VLDL Cholesterol Cal: 16 mg/dL (ref 5–40)

## 2018-05-15 ENCOUNTER — Encounter: Payer: Self-pay | Admitting: Internal Medicine

## 2018-05-15 NOTE — Progress Notes (Signed)
Subjective:     Patient ID: Candice Hughes , female    DOB: 04-Oct-1970 , 47 y.o.   MRN: [id number]   Chief Complaint  Patient presents with  . Hyperlipidemia    HPI  Hyperlipidemia  This is a chronic problem. The current episode started more than 1 month ago. Recent lipid tests were reviewed and are high. Pertinent negatives include no chest pain, focal sensory loss, focal weakness, leg pain, myalgias or shortness of breath. Current antihyperlipidemic treatment includes statins. There are no compliance problems.    She was started on pravastatin at her last visit. She has not had any issues with the medication.   Past Medical History:  Diagnosis Date  . Acne 03/15/2014  . Cancer [facility]) 2005   history of lymphoma-chest tumor  . Headache(784.0)   . History of blood transfusion 2005   surgery related- [place]  . Hypertension      Family History  Problem Relation Age of Onset  . Hypertension Mother   . Hypertension Father   . Hyperlipidemia Father      Current Outpatient Medications:  .  Cholecalciferol (VITAMIN D-3) 5000 UNITS TABS, Take by mouth daily., Disp: , Rfl:  .  estradiol (MINIVELLE) 0.0375 MG/24HR, Place 1 patch onto the skin 2 (two) times a week., Disp: , Rfl:  .  fexofenadine (ALLEGRA) 180 MG tablet, Take 180 mg by mouth daily., Disp: , Rfl:  .  fluticasone (FLONASE) 50 MCG/ACT nasal spray, SPRAY 1 SPRAY INTO EACH NOSTRIL EVERY DAY, Disp: 16 g, Rfl: 1 .  ibuprofen (ADVIL,MOTRIN) 600 MG tablet, Take 1 tablet (600 mg total) by mouth every 6 (six) hours as needed (mild pain)., Disp: 30 tablet, Rfl: 0 .  pravastatin (PRAVACHOL) 40 MG tablet, TAKE 1 TABLET BY ORAL ROUTE EVERY DAY IN THE EVENINGS, Disp: 30 tablet, Rfl: 1 .  spironolactone (ALDACTONE) 50 MG tablet, TAKE 1 TABLET BY MOUTH EVERY DAY IN THE MORNING, Disp: , Rfl: 12 .  montelukast (SINGULAIR) 10 MG tablet, Take 1 tablet (10 mg total) by mouth daily., Disp: 30 tablet, Rfl: 5   Allergies  Allergen  Reactions  . Iohexol      Code: HIVES, Desc: hives, itching, itchy throat, premed w/ benadryl prior to scan, (done w/ 50mg  just prior to scan 05/24/08 w/ no probs dr hall approved)      Review of Systems  Constitutional: Negative.   HENT: Positive for postnasal drip and rhinorrhea.   Respiratory: Negative.  Negative for shortness of breath.   Cardiovascular: Negative.  Negative for chest pain.  Gastrointestinal: Negative.   Musculoskeletal: Negative for myalgias.  Neurological: Negative.  Negative for focal weakness.  Psychiatric/Behavioral: Negative.      Today's Vitals   05/12/18 1608 05/12/18 1649  BP: (!) 122/98 (!) 128/92  Pulse: 88   Temp: 98.4 F (36.9 C)   TempSrc: Oral   Weight: 148 lb 3.2 oz (67.2 kg)   Height: 5\' 2"  (1.575 m)    Body mass index is 27.11 kg/m.   Objective:  Physical Exam  Constitutional: She is oriented to person, place, and time. She appears well-developed and well-nourished.  HENT:  Head: Normocephalic and atraumatic.  Eyes: EOM are normal.  Cardiovascular: Normal rate, regular rhythm and normal heart sounds.  Pulmonary/Chest: Effort normal and breath sounds normal.  Neurological: She is alert and oriented to person, place, and time.  Psychiatric: She has a normal mood and affect.  Nursing note and vitals reviewed.  Assessment And Plan:     1. Pure hypercholesterolemia  I will check non-fasting lipid panel today.  I will make further recommendations once her labs are available for review.   - Lipid Profile  2. Essential hypertension, benign  Fair control. She will continue with current meds. She is encouraged to avoid adding salt to her foods. She is also encouraged to incorporate more exercise into her daily routine.   3. Non-seasonal allergic rhinitis due to other allergic trigger  She is encouraged to resume singulair 10mg  once daily. Refill was sent to the pharmacy. She will let me know if her sx persist.   4. Drug  therapy  - Liver Profile     Maximino Greenland, MD

## 2018-05-15 NOTE — Progress Notes (Signed)
Your cholesterol is great. Please continue with current meds. Your liver enzymes are nl.

## 2018-05-16 ENCOUNTER — Telehealth: Payer: Self-pay

## 2018-05-16 NOTE — Telephone Encounter (Signed)
-----   Message from Glendale Chard, MD sent at 05/15/2018  3:44 PM EST ----- Your cholesterol is great. Please continue with current meds. Your liver enzymes are nl.

## 2018-05-16 NOTE — Telephone Encounter (Signed)
Left a message for the pt to call back for her lab results.

## 2018-05-24 ENCOUNTER — Other Ambulatory Visit: Payer: Self-pay | Admitting: Internal Medicine

## 2018-06-24 ENCOUNTER — Other Ambulatory Visit: Payer: Self-pay | Admitting: Internal Medicine

## 2018-07-07 ENCOUNTER — Ambulatory Visit (INDEPENDENT_AMBULATORY_CARE_PROVIDER_SITE_OTHER): Payer: BC Managed Care – PPO

## 2018-07-07 ENCOUNTER — Other Ambulatory Visit: Payer: Self-pay | Admitting: Podiatry

## 2018-07-07 ENCOUNTER — Encounter: Payer: Self-pay | Admitting: Podiatry

## 2018-07-07 ENCOUNTER — Ambulatory Visit: Payer: BC Managed Care – PPO | Admitting: Podiatry

## 2018-07-07 VITALS — BP 147/92 | HR 16 | Resp 16

## 2018-07-07 DIAGNOSIS — R05 Cough: Secondary | ICD-10-CM | POA: Insufficient documentation

## 2018-07-07 DIAGNOSIS — S9031XA Contusion of right foot, initial encounter: Secondary | ICD-10-CM | POA: Diagnosis not present

## 2018-07-07 DIAGNOSIS — S99921A Unspecified injury of right foot, initial encounter: Secondary | ICD-10-CM

## 2018-07-07 DIAGNOSIS — J029 Acute pharyngitis, unspecified: Secondary | ICD-10-CM | POA: Insufficient documentation

## 2018-07-07 DIAGNOSIS — R059 Cough, unspecified: Secondary | ICD-10-CM | POA: Insufficient documentation

## 2018-07-07 DIAGNOSIS — I10 Essential (primary) hypertension: Secondary | ICD-10-CM | POA: Insufficient documentation

## 2018-07-07 NOTE — Progress Notes (Signed)
   Subjective:    Patient ID: Candice Hughes, female    DOB: 04-Dec-1970, 48 y.o.   MRN: [id number]  HPI    Review of Systems  All other systems reviewed and are negative.      Objective:   Physical Exam        Assessment & Plan:

## 2018-07-08 NOTE — Progress Notes (Signed)
Subjective:   Patient ID: Candice Hughes, female   DOB: 48 y.o.   MRN: [id number]   HPI Patient presents with a painful right hallux stating that on New Year's Eve she traumatized her toe and she thinks that she broke it.  She is not able to wear regular shoe and is currently wearing a surgical shoe and patient does not smoke and likes to be active   Review of Systems  All other systems reviewed and are negative.       Objective:  Physical Exam Vitals signs and nursing note reviewed.  Constitutional:      Appearance: She is well-developed.  Pulmonary:     Effort: Pulmonary effort is normal.  Musculoskeletal: Normal range of motion.  Skin:    General: Skin is warm.  Neurological:     Mental Status: She is alert.     Neurovascular status intact muscle strength is adequate range of motion within normal limits with patient found to have a swollen right hallux inner phalangeal joint with inflammation when I pressed into the area and when I move the toe back-and-forth.  There was no crepitus of the joint and it is moderately swollen.  Patient has good digital perfusion     Assessment:  Probability for fracture of the right hallux with pain still present but no instability     Plan:  H&P condition reviewed and I discussed my findings and the fact that there is a fracture of the base of the distal phalanx.  At this point it appears stable not involving the joint and does not appear to be displaced so we will continue surgical shoes and gradually begin other shoes and hopefully this will heal but I did explain it should take 8 to 12 weeks and ultimately may require surgery if the cartilage is compromised by the injury  X-ray indicates fracture of the medial base of the distal phalanx right hallux

## 2018-07-11 ENCOUNTER — Other Ambulatory Visit: Payer: Self-pay

## 2018-07-11 MED ORDER — ESTRADIOL 0.0375 MG/24HR TD PTTW: 1.0000 | MEDICATED_PATCH | TRANSDERMAL | 2 refills | Status: DC

## 2018-07-21 ENCOUNTER — Other Ambulatory Visit: Payer: Self-pay | Admitting: Internal Medicine

## 2018-07-22 ENCOUNTER — Other Ambulatory Visit: Payer: Self-pay | Admitting: Internal Medicine

## 2018-07-27 ENCOUNTER — Other Ambulatory Visit: Payer: Self-pay | Admitting: Internal Medicine

## 2018-07-27 MED ORDER — ESCITALOPRAM OXALATE 10 MG PO TABS: 10.0000 mg | ORAL_TABLET | Freq: Every day | ORAL | 1 refills | Status: DC

## 2018-08-19 ENCOUNTER — Other Ambulatory Visit: Payer: Self-pay | Admitting: Internal Medicine

## 2018-08-24 ENCOUNTER — Encounter: Payer: Self-pay | Admitting: Internal Medicine

## 2018-08-24 ENCOUNTER — Ambulatory Visit: Payer: BC Managed Care – PPO | Admitting: Internal Medicine

## 2018-08-24 ENCOUNTER — Other Ambulatory Visit: Payer: Self-pay

## 2018-08-24 VITALS — BP 128/72 | HR 85 | Temp 98.7°F | Ht 62.4 in | Wt 151.8 lb

## 2018-08-24 DIAGNOSIS — R7309 Other abnormal glucose: Secondary | ICD-10-CM

## 2018-08-24 DIAGNOSIS — I10 Essential (primary) hypertension: Secondary | ICD-10-CM

## 2018-08-24 DIAGNOSIS — E7800 Pure hypercholesterolemia, unspecified: Secondary | ICD-10-CM | POA: Diagnosis not present

## 2018-08-24 DIAGNOSIS — N951 Menopausal and female climacteric states: Secondary | ICD-10-CM

## 2018-08-24 MED ORDER — ESTRADIOL 0.075 MG/24HR TD PTTW: 1.0000 | MEDICATED_PATCH | TRANSDERMAL | 1 refills | Status: DC

## 2018-08-24 NOTE — Patient Instructions (Signed)

## 2018-08-25 LAB — BMP8+EGFR
BUN/Creatinine Ratio: 16 (ref 9–23)
BUN: 13 mg/dL (ref 6–24)
CO2: 25 mmol/L (ref 20–29)
CREATININE: 0.8 mg/dL (ref 0.57–1.00)
Calcium: 9.8 mg/dL (ref 8.7–10.2)
Chloride: 100 mmol/L (ref 96–106)
GFR calc Af Amer: 100 mL/min/{1.73_m2} (ref >59)
GFR, EST NON AFRICAN AMERICAN: 87 mL/min/{1.73_m2} (ref >59)
Glucose: 88 mg/dL (ref 65–99)
Potassium: 4.4 mmol/L (ref 3.5–5.2)
Sodium: 141 mmol/L (ref 134–144)

## 2018-08-25 LAB — HEMOGLOBIN A1C
ESTIMATED AVERAGE GLUCOSE: 114 mg/dL
Hgb A1c MFr Bld: 5.6 % (ref 4.8–5.6)

## 2018-08-28 DIAGNOSIS — I10 Essential (primary) hypertension: Secondary | ICD-10-CM | POA: Insufficient documentation

## 2018-08-28 DIAGNOSIS — R7309 Other abnormal glucose: Secondary | ICD-10-CM | POA: Insufficient documentation

## 2018-08-28 DIAGNOSIS — E7800 Pure hypercholesterolemia, unspecified: Secondary | ICD-10-CM | POA: Insufficient documentation

## 2018-08-28 DIAGNOSIS — N951 Menopausal and female climacteric states: Secondary | ICD-10-CM | POA: Insufficient documentation

## 2018-08-28 NOTE — Progress Notes (Signed)
Subjective:     Patient ID: Candice Hughes , female    DOB: [birth year] , 48 y.o.   MRN: [id number]   Chief Complaint  Patient presents with  . Hypertension    HPI  Hypertension  This is a chronic problem. The current episode started more than 1 year ago. The problem has been gradually improving since onset. The problem is controlled. Pertinent negatives include no blurred vision, chest pain, palpitations or shortness of breath.   Reports compliance with meds.   Past Medical History:  Diagnosis Date  . Acne 03/15/2014  . Cancer [facility]) 2005   history of lymphoma-chest tumor  . Headache(784.0)   . History of blood transfusion 2005   surgery related- [place]  . Hypertension      Family History  Problem Relation Age of Onset  . Hypertension Mother   . Hypertension Father   . Hyperlipidemia Father      Current Outpatient Medications:  .  Cholecalciferol (VITAMIN D-3) 5000 UNITS TABS, Take by mouth daily., Disp: , Rfl:  .  fexofenadine (ALLEGRA) 180 MG tablet, Take 180 mg by mouth daily., Disp: , Rfl:  .  fluticasone (FLONASE) 50 MCG/ACT nasal spray, SPRAY 1 SPRAY INTO EACH NOSTRIL EVERY DAY, Disp: 16 g, Rfl: 1 .  ibuprofen (ADVIL,MOTRIN) 600 MG tablet, Take 1 tablet (600 mg total) by mouth every 6 (six) hours as needed (mild pain)., Disp: 30 tablet, Rfl: 0 .  pravastatin (PRAVACHOL) 40 MG tablet, TAKE 1 TABLET BY MOUTH EVERY DAY IN THE EVENING, Disp: 30 tablet, Rfl: 1 .  spironolactone (ALDACTONE) 50 MG tablet, TAKE 1 TABLET BY MOUTH EVERY DAY IN THE MORNING, Disp: , Rfl: 12 .  escitalopram (LEXAPRO) 10 MG tablet, TAKE 1 TABLET BY MOUTH EVERY DAY (Patient not taking: Reported on 08/24/2018), Disp: 90 tablet, Rfl: 1 .  estradiol (VIVELLE-DOT) 0.075 MG/24HR, Place 1 patch onto the skin 2 (two) times a week., Disp: 8 patch, Rfl: 1 .  montelukast (SINGULAIR) 10 MG tablet, Take 1 tablet (10 mg total) by mouth daily. (Patient not taking: Reported on 08/24/2018), Disp: 30 tablet, Rfl:  5   Allergies  Allergen Reactions  . Iodinated Diagnostic Agents Itching  . Iohexol      Code: HIVES, Desc: hives, itching, itchy throat, premed w/ benadryl prior to scan, (done w/ 50m just prior to scan 05/24/08 w/ no probs dr hall approved)      Review of Systems  Constitutional: Negative.   Eyes: Negative for blurred vision.  Respiratory: Negative.  Negative for shortness of breath.   Cardiovascular: Negative.  Negative for chest pain and palpitations.  Gastrointestinal: Negative.   Neurological: Negative.   Psychiatric/Behavioral: Negative.      Today's Vitals   08/24/18 1600  BP: 128/72  Pulse: 85  Temp: 98.7 F (37.1 C)  TempSrc: Oral  SpO2: 97%  Weight: 151 lb 12.8 oz (68.9 kg)  Height: 5' 2.4" (1.585 m)   Body mass index is 27.41 kg/m.   Objective:  Physical Exam Vitals signs and nursing note reviewed.  Constitutional:      Appearance: Normal appearance.  HENT:     Head: Normocephalic and atraumatic.  Cardiovascular:     Rate and Rhythm: Normal rate and regular rhythm.     Heart sounds: Normal heart sounds.  Pulmonary:     Effort: Pulmonary effort is normal.     Breath sounds: Normal breath sounds.  Skin:    General: Skin is warm.  Neurological:  General: No focal deficit present.     Mental Status: She is alert.  Psychiatric:        Mood and Affect: Mood normal.        Behavior: Behavior normal.         Assessment And Plan:     1. Essential hypertension, benign  Well controlled. She will continue with current meds. She is encouraged to avoid adding salt to her foods.   - BMP8+EGFR  2. Other abnormal glucose  HER A1C HAS BEEN ELEVATED IN THE PAST. I WILL CHECK AN A1C, BMET TODAY. SHE WAS ENCOURAGED TO AVOID SUGARY BEVERAGES AND PROCESSED FOODS INCLUDNG BREADS, RICE AND PASTA.  - Hemoglobin A1c  3. Pure hypercholesterolemia  She reports compliance with pravastatin daily. She is encouraged to continue with current meds.   4.  Female climacteric state  I will decrease Vivelle Dot to 0.075 twice weekly. My goal is to wean her off of this completely by the end of the year. I will decrease dose every 8 weeks.   Maximino Greenland, MD

## 2018-09-13 ENCOUNTER — Other Ambulatory Visit: Payer: Self-pay | Admitting: Internal Medicine

## 2018-09-15 ENCOUNTER — Ambulatory Visit: Payer: Self-pay | Admitting: Internal Medicine

## 2018-09-15 ENCOUNTER — Other Ambulatory Visit: Payer: Self-pay | Admitting: Internal Medicine

## 2018-10-19 ENCOUNTER — Other Ambulatory Visit: Payer: Self-pay | Admitting: Internal Medicine

## 2018-11-19 ENCOUNTER — Other Ambulatory Visit: Payer: Self-pay | Admitting: Internal Medicine

## 2018-11-28 ENCOUNTER — Other Ambulatory Visit: Payer: Self-pay | Admitting: Internal Medicine

## 2019-02-07 ENCOUNTER — Other Ambulatory Visit: Payer: Self-pay | Admitting: Internal Medicine

## 2019-03-02 ENCOUNTER — Other Ambulatory Visit: Payer: Self-pay | Admitting: Internal Medicine

## 2019-04-05 ENCOUNTER — Other Ambulatory Visit: Payer: Self-pay | Admitting: Internal Medicine

## 2019-04-10 ENCOUNTER — Encounter: Payer: Self-pay | Admitting: Internal Medicine

## 2019-05-07 ENCOUNTER — Other Ambulatory Visit: Payer: Self-pay | Admitting: Internal Medicine

## 2019-05-14 ENCOUNTER — Other Ambulatory Visit: Payer: Self-pay | Admitting: Internal Medicine

## 2019-05-23 ENCOUNTER — Other Ambulatory Visit: Payer: Self-pay | Admitting: Internal Medicine

## 2019-06-05 ENCOUNTER — Other Ambulatory Visit: Payer: Self-pay | Admitting: Internal Medicine

## 2019-06-10 ENCOUNTER — Other Ambulatory Visit: Payer: Self-pay | Admitting: Internal Medicine

## 2019-06-16 ENCOUNTER — Other Ambulatory Visit: Payer: Self-pay | Admitting: Internal Medicine

## 2019-07-06 ENCOUNTER — Other Ambulatory Visit: Payer: Self-pay

## 2019-07-06 ENCOUNTER — Encounter: Payer: Self-pay | Admitting: Internal Medicine

## 2019-07-06 ENCOUNTER — Ambulatory Visit: Payer: BC Managed Care – PPO | Admitting: Internal Medicine

## 2019-07-06 VITALS — BP 130/88 | HR 92 | Temp 98.4°F | Ht 62.4 in | Wt 153.2 lb

## 2019-07-06 DIAGNOSIS — Z23 Encounter for immunization: Secondary | ICD-10-CM | POA: Diagnosis not present

## 2019-07-06 DIAGNOSIS — L659 Nonscarring hair loss, unspecified: Secondary | ICD-10-CM | POA: Diagnosis not present

## 2019-07-06 DIAGNOSIS — I10 Essential (primary) hypertension: Secondary | ICD-10-CM | POA: Diagnosis not present

## 2019-07-06 DIAGNOSIS — Z0000 Encounter for general adult medical examination without abnormal findings: Secondary | ICD-10-CM | POA: Diagnosis not present

## 2019-07-06 LAB — POCT URINALYSIS DIPSTICK
Bilirubin, UA: NEGATIVE
Glucose, UA: NEGATIVE
Ketones, UA: NEGATIVE
Leukocytes, UA: NEGATIVE
Nitrite, UA: NEGATIVE
Protein, UA: NEGATIVE
Spec Grav, UA: 1.01 (ref 1.010–1.025)
Urobilinogen, UA: 0.2 E.U./dL
pH, UA: 6 (ref 5.0–8.0)

## 2019-07-06 LAB — POCT UA - MICROALBUMIN
Albumin/Creatinine Ratio, Urine, POC: <30
Creatinine, POC: 10 mg/dL
Microalbumin Ur, POC: 10 mg/L

## 2019-07-06 MED ORDER — PRAVASTATIN SODIUM 40 MG PO TABS: ORAL_TABLET | ORAL | 1 refills | Status: DC

## 2019-07-06 MED ORDER — ESTRADIOL 0.0375 MG/24HR TD PTTW: 1.0000 | MEDICATED_PATCH | TRANSDERMAL | 2 refills | Status: DC

## 2019-07-06 NOTE — Patient Instructions (Signed)
Health Maintenance, Female Adopting a healthy lifestyle and getting preventive care are important in promoting health and wellness. Ask your health care provider about:  The right schedule for you to have regular tests and exams.  Things you can do on your own to prevent diseases and keep yourself healthy. What should I know about diet, weight, and exercise? Eat a healthy diet   Eat a diet that includes plenty of vegetables, fruits, low-fat dairy products, and lean protein.  Do not eat a lot of foods that are high in solid fats, added sugars, or sodium. Maintain a healthy weight Body mass index (BMI) is used to identify weight problems. It estimates body fat based on height and weight. Your health care provider can help determine your BMI and help you achieve or maintain a healthy weight. Get regular exercise Get regular exercise. This is one of the most important things you can do for your health. Most adults should:  Exercise for at least 150 minutes each week. The exercise should increase your heart rate and make you sweat (moderate-intensity exercise).  Do strengthening exercises at least twice a week. This is in addition to the moderate-intensity exercise.  Spend less time sitting. Even light physical activity can be beneficial. Watch cholesterol and blood lipids Have your blood tested for lipids and cholesterol at 51 years of age, then have this test every 5 years. Have your cholesterol levels checked more often if:  Your lipid or cholesterol levels are high.  You are older than 51 years of age.  You are at high risk for heart disease. What should I know about cancer screening? Depending on your health history and family history, you may need to have cancer screening at various ages. This may include screening for:  Breast cancer.  Cervical cancer.  Colorectal cancer.  Skin cancer.  Lung cancer. What should I know about heart disease, diabetes, and high blood  pressure? Blood pressure and heart disease  High blood pressure causes heart disease and increases the risk of stroke. This is more likely to develop in people who have high blood pressure readings, are of African descent, or are overweight.  Have your blood pressure checked: ? Every 3-5 years if you are 18-39 years of age. ? Every year if you are 40 years old or older. Diabetes Have regular diabetes screenings. This checks your fasting blood sugar level. Have the screening done:  Once every three years after age 40 if you are at a normal weight and have a low risk for diabetes.  More often and at a younger age if you are overweight or have a high risk for diabetes. What should I know about preventing infection? Hepatitis B If you have a higher risk for hepatitis B, you should be screened for this virus. Talk with your health care provider to find out if you are at risk for hepatitis B infection. Hepatitis C Testing is recommended for:  Everyone born from 1945 through 1965.  Anyone with known risk factors for hepatitis C. Sexually transmitted infections (STIs)  Get screened for STIs, including gonorrhea and chlamydia, if: ? You are sexually active and are younger than 51 years of age. ? You are older than 51 years of age and your health care provider tells you that you are at risk for this type of infection. ? Your sexual activity has changed since you were last screened, and you are at increased risk for chlamydia or gonorrhea. Ask your health care provider if   you are at risk.  Ask your health care provider about whether you are at high risk for HIV. Your health care provider may recommend a prescription medicine to help prevent HIV infection. If you choose to take medicine to prevent HIV, you should first get tested for HIV. You should then be tested every 3 months for as long as you are taking the medicine. Pregnancy  If you are about to stop having your period (premenopausal) and  you may become pregnant, seek counseling before you get pregnant.  Take 400 to 800 micrograms (mcg) of folic acid every day if you become pregnant.  Ask for birth control (contraception) if you want to prevent pregnancy. Osteoporosis and menopause Osteoporosis is a disease in which the bones lose minerals and strength with aging. This can result in bone fractures. If you are 65 years old or older, or if you are at risk for osteoporosis and fractures, ask your health care provider if you should:  Be screened for bone loss.  Take a calcium or vitamin D supplement to lower your risk of fractures.  Be given hormone replacement therapy (HRT) to treat symptoms of menopause. Follow these instructions at home: Lifestyle  Do not use any products that contain nicotine or tobacco, such as cigarettes, e-cigarettes, and chewing tobacco. If you need help quitting, ask your health care provider.  Do not use street drugs.  Do not share needles.  Ask your health care provider for help if you need support or information about quitting drugs. Alcohol use  Do not drink alcohol if: ? Your health care provider tells you not to drink. ? You are pregnant, may be pregnant, or are planning to become pregnant.  If you drink alcohol: ? Limit how much you use to 0-1 drink a day. ? Limit intake if you are breastfeeding.  Be aware of how much alcohol is in your drink. In the [place] one drink equals one 12 oz bottle of beer (355 mL), one 5 oz glass of wine (148 mL), or one 1 oz glass of hard liquor (44 mL). General instructions  Schedule regular health, dental, and eye exams.  Stay current with your vaccines.  Tell your health care provider if: ? You often feel depressed. ? You have ever been abused or do not feel safe at home. Summary  Adopting a healthy lifestyle and getting preventive care are important in promoting health and wellness.  Follow your health care provider's instructions about healthy  diet, exercising, and getting tested or screened for diseases.  Follow your health care provider's instructions on monitoring your cholesterol and blood pressure. This information is not intended to replace advice given to you by your health care provider. Make sure you discuss any questions you have with your health care provider. Document Revised: 06/08/2018 Document Reviewed: 06/08/2018 Elsevier Patient Education  2020 [employer]  

## 2019-07-06 NOTE — Progress Notes (Addendum)
This visit occurred during the SARS-CoV-2 public health emergency.  Safety protocols were in place, including screening questions prior to the visit, additional usage of staff PPE, and extensive cleaning of exam room while observing appropriate contact time as indicated for disinfecting solutions.  Subjective:     Patient ID: Candice Hughes , female    DOB: Jan 09, 1971 , 49 y.o.   MRN: [id number]   Chief Complaint  Patient presents with  . Annual Exam  . Hypertension    HPI  She is here today for a full physical examination. She is followed by Dr. Louretta Shorten. She was last seen this Tuesday, 07/03/2018.    Hypertension This is a chronic problem. The current episode started more than 1 year ago. The problem has been gradually improving since onset. The problem is controlled. Pertinent negatives include no blurred vision, chest pain, palpitations or shortness of breath. Past treatments include diuretics. The current treatment provides moderate improvement.     Past Medical History:  Diagnosis Date  . Acne 03/15/2014  . Cancer [facility]) 2005   history of lymphoma-chest tumor  . Headache(784.0)   . History of blood transfusion 2005   surgery related- [place]  . Hypertension      Family History  Problem Relation Age of Onset  . Hypertension Mother   . Hypertension Father   . Hyperlipidemia Father   . Diabetes Father      Current Outpatient Medications:  .  Cholecalciferol (VITAMIN D-3 PO), Take 2,000 Units by mouth daily. , Disp: , Rfl:  .  escitalopram (LEXAPRO) 10 MG tablet, TAKE 1 TABLET BY MOUTH EVERY DAY, Disp: 90 tablet, Rfl: 1 .  estradiol (VIVELLE-DOT) 0.075 MG/24HR, PLACE 1 PATCH ONTO THE SKIN 2 TIMES A WEEK., Disp: 24 patch, Rfl: 2 .  fexofenadine (ALLEGRA) 180 MG tablet, Take 180 mg by mouth daily., Disp: , Rfl:  .  fluticasone (FLONASE) 50 MCG/ACT nasal spray, SPRAY 1 SPRAY INTO EACH NOSTRIL EVERY DAY, Disp: 48 mL, Rfl: 1 .  ibuprofen (ADVIL,MOTRIN) 600 MG tablet,  Take 1 tablet (600 mg total) by mouth every 6 (six) hours as needed (mild pain)., Disp: 30 tablet, Rfl: 0 .  pravastatin (PRAVACHOL) 40 MG tablet, TAKE 1 TABLET BY MOUTH EVERY DAY IN THE EVENING, Disp: 90 tablet, Rfl: 1 .  spironolactone (ALDACTONE) 50 MG tablet, TAKE 1 TABLET BY MOUTH EVERY DAY IN THE MORNING, Disp: 90 tablet, Rfl: 1   Allergies  Allergen Reactions  . Iodinated Diagnostic Agents Itching  . Iohexol      Code: HIVES, Desc: hives, itching, itchy throat, premed w/ benadryl prior to scan, (done w/ 39m just prior to scan 05/24/08 w/ no probs dr hall approved)       The patient states she uses status post hysterectomy for birth control. Last LMP was Patient's last menstrual period was 11/24/2011.. Negative for Dysmenorrhea. Negative for: breast discharge, breast lump(s), breast pain and breast self exam. Associated symptoms include abnormal vaginal bleeding. Pertinent negatives include abnormal bleeding (hematology), anxiety, decreased libido, depression, difficulty falling sleep, dyspareunia, history of infertility, nocturia, sexual dysfunction, sleep disturbances, urinary incontinence, urinary urgency, vaginal discharge and vaginal itching. Diet regular.    . The patient's tobacco use is:  Social History   Tobacco Use  Smoking Status Never Smoker  Smokeless Tobacco Never Used  . She has been exposed to passive smoke. The patient's alcohol use is:  Social History   Substance and Sexual Activity  Alcohol Use Yes  Comment: socially    Review of Systems  Constitutional: Negative.   HENT: Negative.   Eyes: Negative.  Negative for blurred vision.  Respiratory: Negative.  Negative for shortness of breath.   Cardiovascular: Negative.  Negative for chest pain and palpitations.  Endocrine: Negative.   Genitourinary: Negative.   Musculoskeletal: Negative.   Skin: Negative.        She c/o hair loss. She is not sure if it is related to her blood pressure medication. She is  currently on spironolactone, per her request, because she feels this also helps her skin (it was previously prescribed by dermatologist).   Allergic/Immunologic: Negative.   Neurological: Negative.   Hematological: Negative.   Psychiatric/Behavioral: Negative.      Today's Vitals   07/06/19 1539  BP: 130/88  Pulse: 92  Temp: 98.4 F (36.9 C)  TempSrc: Oral  Weight: 153 lb 3.2 oz (69.5 kg)  Height: 5' 2.4" (1.585 m)   Body mass index is 27.66 kg/m.   Objective:  Physical Exam Vitals and nursing note reviewed.  Constitutional:      Appearance: Normal appearance.  HENT:     Head: Normocephalic and atraumatic.     Right Ear: Tympanic membrane, ear canal and external ear normal.     Left Ear: Tympanic membrane, ear canal and external ear normal.     Nose:     Comments: Deferred, masked    Mouth/Throat:     Comments: Deferred, masked Eyes:     Extraocular Movements: Extraocular movements intact.     Conjunctiva/sclera: Conjunctivae normal.     Pupils: Pupils are equal, round, and reactive to light.  Cardiovascular:     Rate and Rhythm: Normal rate and regular rhythm.     Pulses: Normal pulses.     Heart sounds: Normal heart sounds.  Pulmonary:     Effort: Pulmonary effort is normal.     Breath sounds: Normal breath sounds.  Chest:     Breasts: Tanner Score is 5.        Right: Normal.        Left: Normal.  Abdominal:     General: Abdomen is flat. Bowel sounds are normal.     Palpations: Abdomen is soft.  Genitourinary:    Comments: deferred Musculoskeletal:        General: Normal range of motion.     Cervical back: Normal range of motion and neck supple.  Skin:    General: Skin is warm and dry.  Neurological:     General: No focal deficit present.     Mental Status: She is alert and oriented to person, place, and time.  Psychiatric:        Mood and Affect: Mood normal.        Behavior: Behavior normal.         Assessment And Plan:     1. Routine general  medical examination at health care facility  A full exam was performed. Importance of monthly self breast exams was discussed with the patient. PATIENT IS ADVISED TO GET 30-45 MINUTES REGULAR EXERCISE NO LESS THAN FOUR TO FIVE DAYS PER WEEK - BOTH WEIGHTBEARING EXERCISES AND AEROBIC ARE RECOMMENDED. SHE IS ADVISED TO FOLLOW A HEALTHY DIET WITH AT LEAST SIX FRUITS/VEGGIES PER DAY, DECREASE INTAKE OF RED MEAT, AND TO INCREASE FISH INTAKE TO TWO DAYS PER WEEK.  MEATS/FISH SHOULD NOT BE FRIED, BAKED OR BROILED IS PREFERABLE.  I SUGGEST WEARING SPF 50 SUNSCREEN ON EXPOSED PARTS AND ESPECIALLY WHEN  IN THE DIRECT SUNLIGHT FOR AN EXTENDED PERIOD OF TIME.  PLEASE AVOID FAST FOOD RESTAURANTS AND INCREASE YOUR WATER INTAKE.  - CMP14+EGFR - CBC - Lipid panel - Hemoglobin A1c  2. Essential hypertension, benign  Chronic, fair control. She will continue with current meds for now. She is encouraged to avoid adding salt to her foods. EKG performed, no new changes noted. She will rto in six months for re-evaluation.   - POCT Urinalysis Dipstick (81002) - POCT UA - Microalbumin - EKG 12-Lead  3. Hair loss  I will check labs as below. I do not think her sx are due to use of spironolactone. However, I will consider changing meds if these labs are normal.   - TSH - ANA, IFA (with reflex)  4. Need for vaccination  She was given flu vaccine to update her immunization history.   - Flu Vaccine QUAD 6+ mos PF IM (Fluarix Quad PF)   Maximino Greenland, MD    THE PATIENT IS ENCOURAGED TO PRACTICE SOCIAL DISTANCING DUE TO THE COVID-19 PANDEMIC.

## 2019-07-07 LAB — LIPID PANEL
Chol/HDL Ratio: 3.9 ratio (ref 0.0–4.4)
Cholesterol, Total: 243 mg/dL — ABNORMAL HIGH (ref 100–199)
HDL: 63 mg/dL (ref >39)
LDL Chol Calc (NIH): 148 mg/dL — ABNORMAL HIGH (ref 0–99)
Triglycerides: 181 mg/dL — ABNORMAL HIGH (ref 0–149)
VLDL Cholesterol Cal: 32 mg/dL (ref 5–40)

## 2019-07-07 LAB — CMP14+EGFR
ALT: 18 IU/L (ref 0–32)
AST: 19 IU/L (ref 0–40)
Albumin/Globulin Ratio: 2 (ref 1.2–2.2)
Albumin: 4.5 g/dL (ref 3.8–4.8)
Alkaline Phosphatase: 67 IU/L (ref 39–117)
BUN/Creatinine Ratio: 16 (ref 9–23)
BUN: 13 mg/dL (ref 6–24)
Bilirubin Total: <0.2 mg/dL (ref 0.0–1.2)
CO2: 26 mmol/L (ref 20–29)
Calcium: 9.9 mg/dL (ref 8.7–10.2)
Chloride: 102 mmol/L (ref 96–106)
Creatinine, Ser: 0.79 mg/dL (ref 0.57–1.00)
GFR calc Af Amer: 101 mL/min/{1.73_m2} (ref >59)
GFR calc non Af Amer: 88 mL/min/{1.73_m2} (ref >59)
Globulin, Total: 2.2 g/dL (ref 1.5–4.5)
Glucose: 78 mg/dL (ref 65–99)
Potassium: 4.5 mmol/L (ref 3.5–5.2)
Sodium: 140 mmol/L (ref 134–144)
Total Protein: 6.7 g/dL (ref 6.0–8.5)

## 2019-07-07 LAB — CBC
Hematocrit: 39.9 % (ref 34.0–46.6)
Hemoglobin: 13.4 g/dL (ref 11.1–15.9)
MCH: 28.8 pg (ref 26.6–33.0)
MCHC: 33.6 g/dL (ref 31.5–35.7)
MCV: 86 fL (ref 79–97)
Platelets: 288 10*3/uL (ref 150–450)
RBC: 4.66 x10E6/uL (ref 3.77–5.28)
RDW: 12.6 % (ref 11.7–15.4)
WBC: 4 10*3/uL (ref 3.4–10.8)

## 2019-07-07 LAB — ANTINUCLEAR ANTIBODIES, IFA: ANA Titer 1: NEGATIVE

## 2019-07-07 LAB — HEMOGLOBIN A1C
Est. average glucose Bld gHb Est-mCnc: 114 mg/dL
Hgb A1c MFr Bld: 5.6 % (ref 4.8–5.6)

## 2019-07-07 LAB — TSH: TSH: 1.09 u[IU]/mL (ref 0.450–4.500)

## 2019-07-10 ENCOUNTER — Encounter: Payer: Self-pay | Admitting: Internal Medicine

## 2019-07-11 LAB — SPECIMEN STATUS REPORT

## 2019-07-11 LAB — TESTOSTERONE: Testosterone: 6 ng/dL (ref 3–41)

## 2019-07-20 LAB — HM COLONOSCOPY

## 2019-07-25 ENCOUNTER — Encounter: Payer: Self-pay | Admitting: Internal Medicine

## 2019-09-21 ENCOUNTER — Other Ambulatory Visit: Payer: Self-pay | Admitting: Internal Medicine

## 2019-11-22 ENCOUNTER — Other Ambulatory Visit: Payer: Self-pay | Admitting: Internal Medicine

## 2019-12-14 ENCOUNTER — Other Ambulatory Visit: Payer: Self-pay | Admitting: Internal Medicine

## 2019-12-14 NOTE — Telephone Encounter (Signed)
Estriol refill

## 2020-01-04 ENCOUNTER — Encounter: Payer: Self-pay | Admitting: Internal Medicine

## 2020-01-04 ENCOUNTER — Other Ambulatory Visit: Payer: Self-pay

## 2020-01-04 ENCOUNTER — Ambulatory Visit: Payer: BC Managed Care – PPO | Admitting: Internal Medicine

## 2020-01-04 VITALS — BP 134/72 | HR 75 | Temp 98.2°F | Ht 63.4 in | Wt 154.8 lb

## 2020-01-04 DIAGNOSIS — N951 Menopausal and female climacteric states: Secondary | ICD-10-CM | POA: Diagnosis not present

## 2020-01-04 DIAGNOSIS — H6122 Impacted cerumen, left ear: Secondary | ICD-10-CM

## 2020-01-04 DIAGNOSIS — Z1159 Encounter for screening for other viral diseases: Secondary | ICD-10-CM

## 2020-01-04 DIAGNOSIS — E7800 Pure hypercholesterolemia, unspecified: Secondary | ICD-10-CM | POA: Diagnosis not present

## 2020-01-04 DIAGNOSIS — I10 Essential (primary) hypertension: Secondary | ICD-10-CM | POA: Diagnosis not present

## 2020-01-04 NOTE — Patient Instructions (Signed)
Earwax Buildup, Adult The ears produce a substance called earwax that helps keep bacteria out of the ear and protects the skin in the ear canal. Occasionally, earwax can build up in the ear and cause discomfort or hearing loss. What increases the risk? This condition is more likely to develop in people who:  Are female.  Are elderly.  Naturally produce more earwax.  Clean their ears often with cotton swabs.  Use earplugs often.  Use in-ear headphones often.  Wear hearing aids.  Have narrow ear canals.  Have earwax that is overly thick or sticky.  Have eczema.  Are dehydrated.  Have excess hair in the ear canal. What are the signs or symptoms? Symptoms of this condition include:  Reduced or muffled hearing.  A feeling of fullness in the ear or feeling that the ear is plugged.  Fluid coming from the ear.  Ear pain.  Ear itch.  Ringing in the ear.  Coughing.  An obvious piece of earwax that can be seen inside the ear canal. How is this diagnosed? This condition may be diagnosed based on:  Your symptoms.  Your medical history.  An ear exam. During the exam, your health care provider will look into your ear with an instrument called an otoscope. You may have tests, including a hearing test. How is this treated? This condition may be treated by:  Using ear drops to soften the earwax.  Having the earwax removed by a health care provider. The health care provider may: ? Flush the ear with water. ? Use an instrument that has a loop on the end (curette). ? Use a suction device.  Surgery to remove the wax buildup. This may be done in severe cases. Follow these instructions at home:   Take over-the-counter and prescription medicines only as told by your health care provider.  Do not put any objects, including cotton swabs, into your ear. You can clean the opening of your ear canal with a washcloth or facial tissue.  Follow instructions from your health care  provider about cleaning your ears. Do not over-clean your ears.  Drink enough fluid to keep your urine clear or pale yellow. This will help to thin the earwax.  Keep all follow-up visits as told by your health care provider. If earwax builds up in your ears often or if you use hearing aids, consider seeing your health care provider for routine, preventive ear cleanings. Ask your health care provider how often you should schedule your cleanings.  If you have hearing aids, clean them according to instructions from the manufacturer and your health care provider. Contact a health care provider if:  You have ear pain.  You develop a fever.  You have blood, pus, or other fluid coming from your ear.  You have hearing loss.  You have ringing in your ears that does not go away.  Your symptoms do not improve with treatment.  You feel like the room is spinning (vertigo). Summary  Earwax can build up in the ear and cause discomfort or hearing loss.  The most common symptoms of this condition include reduced or muffled hearing and a feeling of fullness in the ear or feeling that the ear is plugged.  This condition may be diagnosed based on your symptoms, your medical history, and an ear exam.  This condition may be treated by using ear drops to soften the earwax or by having the earwax removed by a health care provider.  Do not put any   objects, including cotton swabs, into your ear. You can clean the opening of your ear canal with a washcloth or facial tissue. This information is not intended to replace advice given to you by your health care provider. Make sure you discuss any questions you have with your health care provider. Document Revised: 05/28/2017 Document Reviewed: 08/26/2016 Elsevier Patient Education  2020 Elsevier Inc.  

## 2020-01-04 NOTE — Progress Notes (Signed)
I,Tianna Badgett,acting as a Education administrator for Maximino Greenland, MD.,have documented all relevant documentation on the behalf of Maximino Greenland, MD,as directed by  Maximino Greenland, MD while in the presence of Maximino Greenland, MD.   This visit occurred during the SARS-CoV-2 public health emergency.  Safety protocols were in place, including screening questions prior to the visit, additional usage of staff PPE, and extensive cleaning of exam room while observing appropriate contact time as indicated for disinfecting solutions.  Subjective:     Patient ID: Candice Hughes , female    DOB: 1971-02-21 , 49 y.o.   MRN: [id number]   Chief Complaint  Patient presents with   Hypertension    HPI  She presents today for BP check. She reports compliance with meds. Recently returned from trip to celebrate her 69th wedding anniversary, she is happy to report she and her husband have reconciled.   Hypertension This is a chronic problem. The current episode started more than 1 year ago. The problem has been gradually improving since onset. The problem is controlled. Pertinent negatives include no blurred vision, chest pain, palpitations or shortness of breath. The current treatment provides moderate improvement.     Past Medical History:  Diagnosis Date   Acne 03/15/2014   Cancer [facility]) 2005   history of lymphoma-chest tumor   Headache(784.0)    History of blood transfusion 2005   surgery related- [place]   Hypertension      Family History  Problem Relation Age of Onset   Hypertension Mother    Hypertension Father    Hyperlipidemia Father    Diabetes Father      Current Outpatient Medications:    Cholecalciferol (VITAMIN D-3 PO), Take 2,000 Units by mouth daily. , Disp: , Rfl:    estradiol (VIVELLE-DOT) 0.0375 MG/24HR, PLACE 1 PATCH ONTO THE SKIN 2 (TWO) TIMES A WEEK., Disp: 24 patch, Rfl: 1   fexofenadine (ALLEGRA) 180 MG tablet, Take 180 mg by mouth daily., Disp: , Rfl:     fluticasone (FLONASE) 50 MCG/ACT nasal spray, SPRAY 1 SPRAY INTO EACH NOSTRIL EVERY DAY, Disp: 48 mL, Rfl: 1   ibuprofen (ADVIL,MOTRIN) 600 MG tablet, Take 1 tablet (600 mg total) by mouth every 6 (six) hours as needed (mild pain)., Disp: 30 tablet, Rfl: 0   pravastatin (PRAVACHOL) 40 MG tablet, TAKE 1 TABLET BY MOUTH EVERY DAY IN THE EVENING, Disp: 90 tablet, Rfl: 1   spironolactone (ALDACTONE) 50 MG tablet, TAKE 1 TABLET BY MOUTH EVERY DAY IN THE MORNING, Disp: 90 tablet, Rfl: 1   Allergies  Allergen Reactions   Iodinated Diagnostic Agents Itching   Iohexol      Code: HIVES, Desc: hives, itching, itchy throat, premed w/ benadryl prior to scan, (done w/ 23m just prior to scan 05/24/08 w/ no probs dr hall approved)      Review of Systems  Constitutional: Negative.   Eyes: Negative for blurred vision.  Respiratory: Negative.  Negative for shortness of breath.   Cardiovascular: Negative.  Negative for chest pain and palpitations.  Gastrointestinal: Negative.   Neurological: Positive for dizziness.       She c/o intermittent dizziness. Occurs when changing positions. Denies cp, sob, palpitations. Unsure of other triggers. Denies recent cold/URI.      Today's Vitals   01/04/20 1524  BP: 134/72  Pulse: 75  Temp: 98.2 F (36.8 C)  TempSrc: Oral  Weight: 154 lb 12.8 oz (70.2 kg)  Height: 5' 3.4" (1.61 m)  Body mass index is 27.08 kg/m.   Objective:  Physical Exam Vitals and nursing note reviewed.  Constitutional:      Appearance: Normal appearance.  HENT:     Head: Normocephalic and atraumatic.     Right Ear: Tympanic membrane, ear canal and external ear normal.     Left Ear: Ear canal and external ear normal. There is impacted cerumen.  Cardiovascular:     Rate and Rhythm: Normal rate and regular rhythm.     Heart sounds: Normal heart sounds.  Pulmonary:     Effort: Pulmonary effort is normal.     Breath sounds: Normal breath sounds.  Skin:    General: Skin is  warm.  Neurological:     General: No focal deficit present.     Mental Status: She is alert.  Psychiatric:        Mood and Affect: Mood normal.        Behavior: Behavior normal.          Assessment And Plan:    1. Essential hypertension, benign  Chronic, fair control. Pt is aware that optimal BP control is less than 130/80. She is encouraged to follow a low-sodium diet. I will check renal function today. She will rto in six months for her next physical examination.   - BMP8+EGFR  2. Pure hypercholesterolemia  Chronic, I will check lipid panel today. She is encouraged to limit her fried food intake and to exercise no less than 150 minutes per week.   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 She will continue with current meds.   4. Left ear impacted cerumen  AFTER OBTAINING VERBAL CONSENT, LEFT EAR WAS FLUSHED BY IRRIGATION. SHE TOLERATED PROCEDURE WELL WITHOUT ANY COMPLICATIONS. NO TM ABNORMALITIES WERE NOTED.  Patient advised this likely contributed to her dizziness. She will let me know if her sx persist.   - Ear Lavage  5. Encounter for HCV screening test for low risk patient  - Hepatitis C antibody    Maximino Greenland, MD   I, Maximino Greenland, MD, have reviewed all documentation for this visit. The documentation on 01/06/20 for the exam, diagnosis, procedures, and orders are all accurate and complete.  THE PATIENT IS ENCOURAGED TO PRACTICE SOCIAL DISTANCING DUE TO THE COVID-19 PANDEMIC.

## 2020-01-05 LAB — LIPID PANEL
Chol/HDL Ratio: 3 ratio (ref 0.0–4.4)
Cholesterol, Total: 186 mg/dL (ref 100–199)
HDL: 62 mg/dL (ref >39)
LDL Chol Calc (NIH): 100 mg/dL — ABNORMAL HIGH (ref 0–99)
Triglycerides: 135 mg/dL (ref 0–149)
VLDL Cholesterol Cal: 24 mg/dL (ref 5–40)

## 2020-01-05 LAB — BMP8+EGFR
BUN/Creatinine Ratio: 15 (ref 9–23)
BUN: 13 mg/dL (ref 6–24)
CO2: 27 mmol/L (ref 20–29)
Calcium: 10.1 mg/dL (ref 8.7–10.2)
Chloride: 102 mmol/L (ref 96–106)
Creatinine, Ser: 0.85 mg/dL (ref 0.57–1.00)
GFR calc Af Amer: 92 mL/min/{1.73_m2} (ref >59)
GFR calc non Af Amer: 80 mL/min/{1.73_m2} (ref >59)
Glucose: 95 mg/dL (ref 65–99)
Potassium: 4.5 mmol/L (ref 3.5–5.2)
Sodium: 141 mmol/L (ref 134–144)

## 2020-01-05 LAB — HEPATITIS C ANTIBODY: Hep C Virus Ab: <0.1 {s_co_ratio} (ref 0.0–0.9)

## 2020-01-07 ENCOUNTER — Other Ambulatory Visit: Payer: Self-pay | Admitting: Internal Medicine

## 2020-04-02 ENCOUNTER — Other Ambulatory Visit: Payer: Self-pay | Admitting: Internal Medicine

## 2020-04-04 ENCOUNTER — Ambulatory Visit: Payer: BC Managed Care – PPO | Admitting: Nurse Practitioner

## 2020-06-01 ENCOUNTER — Other Ambulatory Visit: Payer: Self-pay | Admitting: Internal Medicine

## 2020-06-07 ENCOUNTER — Other Ambulatory Visit: Payer: Self-pay | Admitting: Internal Medicine

## 2020-07-16 ENCOUNTER — Encounter: Payer: BC Managed Care – PPO | Admitting: Internal Medicine

## 2020-08-06 ENCOUNTER — Encounter: Payer: Self-pay | Admitting: Nurse Practitioner

## 2020-08-06 LAB — HM COLONOSCOPY

## 2020-08-29 ENCOUNTER — Ambulatory Visit (INDEPENDENT_AMBULATORY_CARE_PROVIDER_SITE_OTHER): Payer: BC Managed Care – PPO | Admitting: Internal Medicine

## 2020-08-29 ENCOUNTER — Other Ambulatory Visit: Payer: Self-pay

## 2020-08-29 ENCOUNTER — Encounter: Payer: Self-pay | Admitting: Internal Medicine

## 2020-08-29 VITALS — BP 132/78 | HR 94 | Temp 97.9°F | Ht 63.6 in | Wt 153.0 lb

## 2020-08-29 DIAGNOSIS — G8929 Other chronic pain: Secondary | ICD-10-CM

## 2020-08-29 DIAGNOSIS — Z0000 Encounter for general adult medical examination without abnormal findings: Secondary | ICD-10-CM

## 2020-08-29 DIAGNOSIS — E7800 Pure hypercholesterolemia, unspecified: Secondary | ICD-10-CM

## 2020-08-29 DIAGNOSIS — N951 Menopausal and female climacteric states: Secondary | ICD-10-CM

## 2020-08-29 DIAGNOSIS — I10 Essential (primary) hypertension: Secondary | ICD-10-CM | POA: Diagnosis not present

## 2020-08-29 DIAGNOSIS — M25511 Pain in right shoulder: Secondary | ICD-10-CM | POA: Diagnosis not present

## 2020-08-29 LAB — POCT UA - MICROALBUMIN
Albumin/Creatinine Ratio, Urine, POC: 30
Creatinine, POC: 200 mg/dL
Microalbumin Ur, POC: 10 mg/L

## 2020-08-29 LAB — POCT URINALYSIS DIPSTICK
Bilirubin, UA: NEGATIVE
Glucose, UA: NEGATIVE
Ketones, UA: NEGATIVE
Leukocytes, UA: NEGATIVE
Nitrite, UA: NEGATIVE
Protein, UA: NEGATIVE
Spec Grav, UA: 1.015 (ref 1.010–1.025)
Urobilinogen, UA: 0.2 E.U./dL
pH, UA: 7 (ref 5.0–8.0)

## 2020-08-29 MED ORDER — ESTRADIOL 0.0375 MG/24HR TD PTTW
1.0000 | MEDICATED_PATCH | TRANSDERMAL | 1 refills | Status: DC
Start: 2020-08-29 — End: 2023-02-16

## 2020-08-29 NOTE — Patient Instructions (Addendum)
Dr. Martyn Ehrich, [facility] chiropractor Dr. Drue Second, [facility] Dr. Rogers Blocker, [facility] Maintenance, Female Adopting a healthy lifestyle and getting preventive care are important in promoting health and wellness. Ask your health care provider about:  The right schedule for you to have regular tests and exams.  Things you can do on your own to prevent diseases and keep yourself healthy. What should I know about diet, weight, and exercise? Eat a healthy diet  Eat a diet that includes plenty of vegetables, fruits, low-fat dairy products, and lean protein.  Do not eat a lot of foods that are high in solid fats, added sugars, or sodium.   Maintain a healthy weight Body mass index (BMI) is used to identify weight problems. It estimates body fat based on height and weight. Your health care provider can help determine your BMI and help you achieve or maintain a healthy weight. Get regular exercise Get regular exercise. This is one of the most important things you can do for your health. Most adults should:  Exercise for at least 150 minutes each week. The exercise should increase your heart rate and make you sweat (moderate-intensity exercise).  Do strengthening exercises at least twice a week. This is in addition to the moderate-intensity exercise.  Spend less time sitting. Even light physical activity can be beneficial. Watch cholesterol and blood lipids Have your blood tested for lipids and cholesterol at 52 years of age, then have this test every 5 years. Have your cholesterol levels checked more often if:  Your lipid or cholesterol levels are high.  You are older than 52 years of age.  You are at high risk for heart disease. What should I know about cancer screening? Depending on your health history and family history, you may need to have cancer screening at various ages. This may include screening for:  Breast cancer.  Cervical  cancer.  Colorectal cancer.  Skin cancer.  Lung cancer. What should I know about heart disease, diabetes, and high blood pressure? Blood pressure and heart disease  High blood pressure causes heart disease and increases the risk of stroke. This is more likely to develop in people who have high blood pressure readings, are of African descent, or are overweight.  Have your blood pressure checked: ? Every 3-5 years if you are 3-92 years of age. ? Every year if you are 64 years old or older. Diabetes Have regular diabetes screenings. This checks your fasting blood sugar level. Have the screening done:  Once every three years after age 50 if you are at a normal weight and have a low risk for diabetes.  More often and at a younger age if you are overweight or have a high risk for diabetes. What should I know about preventing infection? Hepatitis B If you have a higher risk for hepatitis B, you should be screened for this virus. Talk with your health care provider to find out if you are at risk for hepatitis B infection. Hepatitis C Testing is recommended for:  Everyone born from [birth year] through 1967.  Anyone with known risk factors for hepatitis C. Sexually transmitted infections (STIs)  Get screened for STIs, including gonorrhea and chlamydia, if: ? You are sexually active and are younger than 52 years of age. ? You are older than 52 years of age and your health care provider tells you that you are at risk for this type of infection. ? Your sexual activity has changed since you were  last screened, and you are at increased risk for chlamydia or gonorrhea. Ask your health care provider if you are at risk.  Ask your health care provider about whether you are at high risk for HIV. Your health care provider may recommend a prescription medicine to help prevent HIV infection. If you choose to take medicine to prevent HIV, you should first get tested for HIV. You should then be tested every 3  months for as long as you are taking the medicine. Pregnancy  If you are about to stop having your period (premenopausal) and you may become pregnant, seek counseling before you get pregnant.  Take 400 to 800 micrograms (mcg) of folic acid every day if you become pregnant.  Ask for birth control (contraception) if you want to prevent pregnancy. Osteoporosis and menopause Osteoporosis is a disease in which the bones lose minerals and strength with aging. This can result in bone fractures. If you are 37 years old or older, or if you are at risk for osteoporosis and fractures, ask your health care provider if you should:  Be screened for bone loss.  Take a calcium or vitamin D supplement to lower your risk of fractures.  Be given hormone replacement therapy (HRT) to treat symptoms of menopause. Follow these instructions at home: Lifestyle  Do not use any products that contain nicotine or tobacco, such as cigarettes, e-cigarettes, and chewing tobacco. If you need help quitting, ask your health care provider.  Do not use street drugs.  Do not share needles.  Ask your health care provider for help if you need support or information about quitting drugs. Alcohol use  Do not drink alcohol if: ? Your health care provider tells you not to drink. ? You are pregnant, may be pregnant, or are planning to become pregnant.  If you drink alcohol: ? Limit how much you use to 0-1 drink a day. ? Limit intake if you are breastfeeding.  Be aware of how much alcohol is in your drink. In the [place] one drink equals one 12 oz bottle of beer (355 mL), one 5 oz glass of wine (148 mL), or one 1 oz glass of hard liquor (44 mL). General instructions  Schedule regular health, dental, and eye exams.  Stay current with your vaccines.  Tell your health care provider if: ? You often feel depressed. ? You have ever been abused or do not feel safe at home. Summary  Adopting a healthy lifestyle and getting  preventive care are important in promoting health and wellness.  Follow your health care provider's instructions about healthy diet, exercising, and getting tested or screened for diseases.  Follow your health care provider's instructions on monitoring your cholesterol and blood pressure. This information is not intended to replace advice given to you by your health care provider. Make sure you discuss any questions you have with your health care provider. Document Revised: 06/08/2018 Document Reviewed: 06/08/2018 Elsevier Patient Education  2021 [employer].

## 2020-08-29 NOTE — Progress Notes (Signed)
I,Katawbba Wiggins,acting as a Education administrator for Maximino Greenland, MD.,have documented all relevant documentation on the behalf of Maximino Greenland, MD,as directed by  Maximino Greenland, MD while in the presence of Maximino Greenland, MD.  This visit occurred during the SARS-CoV-2 public health emergency.  Safety protocols were in place, including screening questions prior to the visit, additional usage of staff PPE, and extensive cleaning of exam room while observing appropriate contact time as indicated for disinfecting solutions.  Subjective:     Patient ID: Candice Hughes , female    DOB: Nov 14, 1970 , 50 y.o.   MRN: [id number]   Chief Complaint  Patient presents with  . Annual Exam  . Hypertension    HPI  She is here today for a full physical examination. She is followed by Dr. Louretta Shorten for her GYN exams. She was last seen 07/2020. She has no specific concerns or complaints at this time.   Hypertension This is a chronic problem. The current episode started more than 1 year ago. The problem has been gradually improving since onset. The problem is controlled. Pertinent negatives include no blurred vision, chest pain, palpitations or shortness of breath. Past treatments include diuretics. The current treatment provides moderate improvement.     Past Medical History:  Diagnosis Date  . Acne 03/15/2014  . Cancer [facility]) 2005   history of lymphoma-chest tumor  . Headache(784.0)   . History of blood transfusion 2005   surgery related- [place]  . Hypertension      Family History  Problem Relation Age of Onset  . Hypertension Mother   . Hypertension Father   . Hyperlipidemia Father   . Diabetes Father      Current Outpatient Medications:  .  Cholecalciferol (VITAMIN D-3 PO), Take 2,000 Units by mouth daily. , Disp: , Rfl:  .  fexofenadine (ALLEGRA) 180 MG tablet, Take 180 mg by mouth daily., Disp: , Rfl:  .  fluticasone (FLONASE) 50 MCG/ACT nasal spray, SPRAY 1 SPRAY INTO EACH NOSTRIL  EVERY DAY, Disp: 48 mL, Rfl: 1 .  ibuprofen (ADVIL,MOTRIN) 600 MG tablet, Take 1 tablet (600 mg total) by mouth every 6 (six) hours as needed (mild pain)., Disp: 30 tablet, Rfl: 0 .  pravastatin (PRAVACHOL) 40 MG tablet, TAKE 1 TABLET BY MOUTH EVERY DAY IN THE EVENING, Disp: 90 tablet, Rfl: 1 .  spironolactone (ALDACTONE) 50 MG tablet, TAKE 1 TABLET BY MOUTH EVERY DAY IN THE MORNING, Disp: 90 tablet, Rfl: 1 .  ALPRAZolam (XANAX) 0.25 MG tablet, Take 0.25 mg by mouth every 8 (eight) hours as needed., Disp: , Rfl:  .  estradiol (VIVELLE-DOT) 0.0375 MG/24HR, Place 1 patch onto the skin 2 (two) times a week., Disp: 24 patch, Rfl: 1   Allergies  Allergen Reactions  . Iodinated Diagnostic Agents Itching  . Iohexol      Code: HIVES, Desc: hives, itching, itchy throat, premed w/ benadryl prior to scan, (done w/ 83m just prior to scan 05/24/08 w/ no probs dr hall approved)       The patient states she uses post menopausal status for birth control. Last LMP was Patient's last menstrual period was 11/24/2011.. Negative for Dysmenorrhea. Negative for: breast discharge, breast lump(s), breast pain and breast self exam. Associated symptoms include abnormal vaginal bleeding. Pertinent negatives include abnormal bleeding (hematology), anxiety, decreased libido, depression, difficulty falling sleep, dyspareunia, history of infertility, nocturia, sexual dysfunction, sleep disturbances, urinary incontinence, urinary urgency, vaginal discharge and vaginal itching. Diet regular.The patient  states her exercise level is  intermittent.  . The patient's tobacco use is:  Social History   Tobacco Use  Smoking Status Never Smoker  Smokeless Tobacco Never Used  . She has been exposed to passive smoke. The patient's alcohol use is:  Social History   Substance and Sexual Activity  Alcohol Use Yes   Comment: socially   Review of Systems  Constitutional: Negative.   HENT: Negative.   Eyes: Negative.  Negative for  blurred vision.  Respiratory: Negative.  Negative for shortness of breath.   Cardiovascular: Negative.  Negative for chest pain and palpitations.  Gastrointestinal: Negative.   Endocrine: Negative.   Genitourinary: Negative.   Musculoskeletal: Positive for arthralgias.       She c/o r shoulder pain. This has been going on for several months. There is pain with movement. Denies fall/trauma. Denies RUE weakness/paresthesias.   Skin: Negative.   Allergic/Immunologic: Negative.   Neurological: Negative.   Hematological: Negative.   Psychiatric/Behavioral: Negative.   All other systems reviewed and are negative.    Today's Vitals   08/29/20 1532  BP: 132/78  Pulse: 94  Temp: 97.9 F (36.6 C)  TempSrc: Oral  Weight: 153 lb (69.4 kg)  Height: 5' 3.6" (1.615 m)   Body mass index is 26.59 kg/m.  Wt Readings from Last 3 Encounters:  08/29/20 153 lb (69.4 kg)  01/04/20 154 lb 12.8 oz (70.2 kg)  07/06/19 153 lb 3.2 oz (69.5 kg)   Objective:  Physical Exam Vitals and nursing note reviewed.  Constitutional:      Appearance: Normal appearance. She is obese.  HENT:     Head: Normocephalic and atraumatic.     Right Ear: Tympanic membrane, ear canal and external ear normal.     Left Ear: Tympanic membrane, ear canal and external ear normal.     Nose:     Comments: Masked     Mouth/Throat:     Comments: Masked  Eyes:     Extraocular Movements: Extraocular movements intact.     Conjunctiva/sclera: Conjunctivae normal.     Pupils: Pupils are equal, round, and reactive to light.  Cardiovascular:     Rate and Rhythm: Normal rate and regular rhythm.     Pulses: Normal pulses.     Heart sounds: Normal heart sounds.  Pulmonary:     Effort: Pulmonary effort is normal.     Breath sounds: Normal breath sounds.  Chest:  Breasts:     Tanner Score is 5.     Right: Normal.     Left: Normal.    Abdominal:     General: Abdomen is flat. Bowel sounds are normal.     Palpations: Abdomen  is soft.  Genitourinary:    Comments: deferred Musculoskeletal:        General: Tenderness present. No signs of injury. Normal range of motion.     Cervical back: Normal range of motion and neck supple.     Right lower leg: No edema.     Left lower leg: No edema.  Skin:    General: Skin is warm and dry.  Neurological:     General: No focal deficit present.     Mental Status: She is alert and oriented to person, place, and time.  Psychiatric:        Mood and Affect: Mood normal.        Behavior: Behavior normal.         Assessment And Plan:  1. Routine general medical examination at health care facility Comments: A full exam was performed. Importance of monthly self breast exams was discussed with the patient. PATIENT IS ADVISED TO GET 30-45 MINUTES REGULAR EXERCISE NO LESS THAN FOUR TO FIVE DAYS PER WEEK - BOTH WEIGHTBEARING EXERCISES AND AEROBIC ARE RECOMMENDED.  PATIENT IS ADVISED TO FOLLOW A HEALTHY DIET WITH AT LEAST SIX FRUITS/VEGGIES PER DAY, DECREASE INTAKE OF RED MEAT, AND TO INCREASE FISH INTAKE TO TWO DAYS PER WEEK.  MEATS/FISH SHOULD NOT BE FRIED, BAKED OR BROILED IS PREFERABLE.  IT IS ALSO IMPORTANT TO CUT BACK ON YOUR SUGAR INTAKE. PLEASE AVOID ANYTHING WITH ADDED SUGAR, CORN SYRUP OR OTHER SWEETENERS. IF YOU MUST USE A SWEETENER, YOU CAN TRY STEVIA. IT IS ALSO IMPORTANT TO AVOID ARTIFICIALLY SWEETENERS AND DIET BEVERAGES. LASTLY, I SUGGEST WEARING SPF 50 SUNSCREEN ON EXPOSED PARTS AND ESPECIALLY WHEN IN THE DIRECT SUNLIGHT FOR AN EXTENDED PERIOD OF TIME.  PLEASE AVOID FAST FOOD RESTAURANTS AND INCREASE YOUR WATER INTAKE.  - CBC - CMP14+EGFR - Lipid panel  2. Essential hypertension, benign Comments: Chronic, controlled.  She will c/w spironolactone daily.  EKG performed, NSR w/o acute changes. Advised to avoid adding salt to her foods. She wil f/u in six months for re-evaluation.  - POCT Urinalysis Dipstick (81002) - POCT UA - Microalbumin - EKG 12-Lead  3. Female  climacteric state Comments: She does not wish to decrease dose of Vivelle dot patch at this time. She reports she has d/w her GYN. Pt advised that it is time to titrate the dose of patches downward. She is advised that she should be on lowest dose that is most effective. She has been on current dose for more than six months.   4. Chronic right shoulder pain She is advised to use Voltaren gel to affected area bid-tid prn. She agrees to Ortho referral/evaluation.  - Ambulatory referral to Orthopedic Surgery   Patient was given opportunity to ask questions. Patient verbalized understanding of the plan and was able to repeat key elements of the plan. All questions were answered to their satisfaction.   I, Maximino Greenland, MD, have reviewed all documentation for this visit. The documentation on 08/29/20 for the exam, diagnosis, procedures, and orders are all accurate and complete.  THE PATIENT IS ENCOURAGED TO PRACTICE SOCIAL DISTANCING DUE TO THE COVID-19 PANDEMIC.

## 2020-08-30 LAB — CMP14+EGFR
ALT: 23 IU/L (ref 0–32)
AST: 21 IU/L (ref 0–40)
Albumin/Globulin Ratio: 1.8 (ref 1.2–2.2)
Albumin: 4.7 g/dL (ref 3.8–4.9)
Alkaline Phosphatase: 69 IU/L (ref 44–121)
BUN/Creatinine Ratio: 14 (ref 9–23)
BUN: 12 mg/dL (ref 6–24)
Bilirubin Total: 0.2 mg/dL (ref 0.0–1.2)
CO2: 25 mmol/L (ref 20–29)
Calcium: 10 mg/dL (ref 8.7–10.2)
Chloride: 101 mmol/L (ref 96–106)
Creatinine, Ser: 0.87 mg/dL (ref 0.57–1.00)
Globulin, Total: 2.6 g/dL (ref 1.5–4.5)
Glucose: 89 mg/dL (ref 65–99)
Potassium: 4.3 mmol/L (ref 3.5–5.2)
Sodium: 141 mmol/L (ref 134–144)
Total Protein: 7.3 g/dL (ref 6.0–8.5)
eGFR: 81 mL/min/{1.73_m2} (ref >59)

## 2020-08-30 LAB — LIPID PANEL
Chol/HDL Ratio: 2.7 ratio (ref 0.0–4.4)
Cholesterol, Total: 186 mg/dL (ref 100–199)
HDL: 70 mg/dL (ref >39)
LDL Chol Calc (NIH): 102 mg/dL — ABNORMAL HIGH (ref 0–99)
Triglycerides: 79 mg/dL (ref 0–149)
VLDL Cholesterol Cal: 14 mg/dL (ref 5–40)

## 2020-08-30 LAB — CBC
Hematocrit: 40.5 % (ref 34.0–46.6)
Hemoglobin: 13.5 g/dL (ref 11.1–15.9)
MCH: 29.3 pg (ref 26.6–33.0)
MCHC: 33.3 g/dL (ref 31.5–35.7)
MCV: 88 fL (ref 79–97)
Platelets: 278 10*3/uL (ref 150–450)
RBC: 4.6 x10E6/uL (ref 3.77–5.28)
RDW: 12.5 % (ref 11.7–15.4)
WBC: 5.4 10*3/uL (ref 3.4–10.8)

## 2020-09-04 DIAGNOSIS — G8929 Other chronic pain: Secondary | ICD-10-CM | POA: Insufficient documentation

## 2020-09-11 ENCOUNTER — Other Ambulatory Visit: Payer: Self-pay | Admitting: Internal Medicine

## 2020-09-11 DIAGNOSIS — G8929 Other chronic pain: Secondary | ICD-10-CM

## 2020-09-25 ENCOUNTER — Other Ambulatory Visit: Payer: Self-pay | Admitting: Internal Medicine

## 2020-11-26 ENCOUNTER — Other Ambulatory Visit: Payer: Self-pay | Admitting: Internal Medicine

## 2020-12-04 ENCOUNTER — Other Ambulatory Visit: Payer: Self-pay

## 2020-12-04 ENCOUNTER — Encounter: Payer: Self-pay | Admitting: Nurse Practitioner

## 2020-12-04 ENCOUNTER — Ambulatory Visit (INDEPENDENT_AMBULATORY_CARE_PROVIDER_SITE_OTHER): Payer: BC Managed Care – PPO | Admitting: Nurse Practitioner

## 2020-12-04 ENCOUNTER — Ambulatory Visit
Admission: RE | Admit: 2020-12-04 | Discharge: 2020-12-04 | Disposition: A | Discharge status: Home / Self Care | Payer: BC Managed Care – PPO | Source: Ambulatory Visit | Attending: Nurse Practitioner | Admitting: Nurse Practitioner

## 2020-12-04 DIAGNOSIS — Z8616 Personal history of COVID-19: Secondary | ICD-10-CM

## 2020-12-04 DIAGNOSIS — R059 Cough, unspecified: Secondary | ICD-10-CM

## 2020-12-04 MED ORDER — BENZONATATE 100 MG PO CAPS: 100.0000 mg | ORAL_CAPSULE | Freq: Two times a day (BID) | ORAL | 0 refills | Status: DC | PRN

## 2020-12-04 MED ORDER — HYDROCODONE BIT-HOMATROP MBR 5-1.5 MG/5ML PO SOLN: 5.0000 mL | Freq: Four times a day (QID) | ORAL | 0 refills | Status: DC | PRN

## 2020-12-04 NOTE — Patient Instructions (Signed)

## 2020-12-04 NOTE — Progress Notes (Signed)
I,Tianna Badgett,acting as a Education administrator for [employer], NP.,have documented all relevant documentation on the behalf of [employer], NP,as directed by  Bary Castilla, NP while in the presence of Bary Castilla, NP.  This visit occurred during the SARS-CoV-2 public health emergency.  Safety protocols were in place, including screening questions prior to the visit, additional usage of staff PPE, and extensive cleaning of exam room while observing appropriate contact time as indicated for disinfecting solutions.  Subjective:     Patient ID: Candice Hughes , female    DOB: 1970/11/17 , 50 y.o.   MRN: [id number]   Chief Complaint  Patient presents with  . Cough    HPI  Patient is here for cough. She had covid in April and her cough is persisting longer than desired. She would like a chest x-ray because she has had a history of lymphoma in the past. She has been prescribed cough syrup and antibiotic from the urgent care recently and that has helped. But today she is concerned as to why she has been having this cough. No other symptoms except she does have seasonal allergies. She does use her allergy medicine a needed.   Cough This is a new problem. The current episode started more than 1 month ago. The problem has been gradually improving. The problem occurs hourly. The cough is non-productive. Pertinent negatives include no chills, fever, headaches, sore throat or wheezing. Nothing aggravates the symptoms. She has tried OTC cough suppressant, prescription cough suppressant and steroid inhaler for the symptoms. The treatment provided no relief.     Past Medical History:  Diagnosis Date  . Acne 03/15/2014  . Cancer [facility]) 2005   history of lymphoma-chest tumor  . Headache(784.0)   . History of blood transfusion 2005   surgery related- [place]  . Hypertension      Family History  Problem Relation Age of Onset  . Hypertension Mother   . Hypertension Father   .  Hyperlipidemia Father   . Diabetes Father      Current Outpatient Medications:  .  ALPRAZolam (XANAX) 0.25 MG tablet, Take 0.25 mg by mouth every 8 (eight) hours as needed., Disp: , Rfl:  .  benzonatate (TESSALON PERLES) 100 MG capsule, Take 1 capsule (100 mg total) by mouth 2 (two) times daily as needed for cough., Disp: 30 capsule, Rfl: 0 .  Cholecalciferol (VITAMIN D-3 PO), Take 2,000 Units by mouth daily. , Disp: , Rfl:  .  estradiol (VIVELLE-DOT) 0.0375 MG/24HR, Place 1 patch onto the skin 2 (two) times a week., Disp: 24 patch, Rfl: 1 .  fexofenadine (ALLEGRA) 180 MG tablet, Take 180 mg by mouth daily., Disp: , Rfl:  .  fluticasone (FLONASE) 50 MCG/ACT nasal spray, SPRAY 1 SPRAY INTO EACH NOSTRIL EVERY DAY, Disp: 48 mL, Rfl: 1 .  HYDROcodone bit-homatropine (HYCODAN) 5-1.5 MG/5ML syrup, Take 5 mLs by mouth every 6 (six) hours as needed for cough., Disp: 120 mL, Rfl: 0 .  ibuprofen (ADVIL,MOTRIN) 600 MG tablet, Take 1 tablet (600 mg total) by mouth every 6 (six) hours as needed (mild pain)., Disp: 30 tablet, Rfl: 0 .  pravastatin (PRAVACHOL) 40 MG tablet, TAKE 1 TABLET BY MOUTH EVERY DAY IN THE EVENING, Disp: 90 tablet, Rfl: 1 .  spironolactone (ALDACTONE) 50 MG tablet, TAKE 1 TABLET BY MOUTH EVERY DAY IN THE MORNING, Disp: 90 tablet, Rfl: 1   Allergies  Allergen Reactions  . Iodinated Diagnostic Agents Itching  . Iohexol  Code: HIVES, Desc: hives, itching, itchy throat, premed w/ benadryl prior to scan, (done w/ 50mg  just prior to scan 05/24/08 w/ no probs dr hall approved)      Review of Systems  Constitutional: Negative for chills, fatigue and fever.  HENT: Negative for sinus pressure, sinus pain, sneezing and sore throat.   Respiratory: Positive for cough. Negative for chest tightness and wheezing.   Gastrointestinal: Negative for constipation, diarrhea and nausea.  Neurological: Negative for weakness and headaches.     There were no vitals filed for this visit. There is  no height or weight on file to calculate BMI.   Objective:  Physical Exam Constitutional:      Appearance: Normal appearance.  HENT:     Head: Normocephalic and atraumatic.     Nose: No congestion or rhinorrhea.  Cardiovascular:     Rate and Rhythm: Normal rate and regular rhythm.     Pulses: Normal pulses.     Heart sounds: Normal heart sounds. No murmur heard.   Pulmonary:     Effort: Pulmonary effort is normal. No respiratory distress.     Breath sounds: Normal breath sounds. No wheezing.  Skin:    General: Skin is warm and dry.     Capillary Refill: Capillary refill takes less than 2 seconds.  Neurological:     Mental Status: She is alert.         Assessment And Plan:     1. Cough - DG Chest 2 View; Future - HYDROcodone bit-homatropine (HYCODAN) 5-1.5 MG/5ML syrup; Take 5 mLs by mouth every 6 (six) hours as needed for cough.  Dispense: 120 mL; Refill: 0 - benzonatate (TESSALON PERLES) 100 MG capsule; Take 1 capsule (100 mg total) by mouth 2 (two) times daily as needed for cough.  Dispense: 30 capsule; Refill: 0  Due to her history of lymphoma, will send her for chest xray. Will send a prescription for cough syrup and tessalon Perles. Instructed patient if her symptoms get worse to give [place] a call.   The patient was encouraged to call or send a message through [place] for any questions or concerns.   Side effects and appropriate use of all the medication(s) were discussed with the patient today. Patient advised to use the medication(s) as directed by their healthcare provider. The patient was encouraged to read, review, and understand all associated package inserts and contact our office with any questions or concerns. The patient accepts the risks of the treatment plan and had an opportunity to ask questions.   Patient was given opportunity to ask questions. Patient verbalized understanding of the plan and was able to repeat key elements of the plan. All questions were  answered to their satisfaction.  Raman Deaken Jurgens, DNP   I, Raman Analaura Messler have reviewed all documentation for this visit. The documentation on 12/04/20 for the exam, diagnosis, procedures, and orders are all accurate and complete.    IF YOU HAVE BEEN REFERRED TO A SPECIALIST, IT MAY TAKE 1-2 WEEKS TO SCHEDULE/PROCESS THE REFERRAL. IF YOU HAVE NOT HEARD FROM US/SPECIALIST IN TWO WEEKS, PLEASE GIVE [place] A CALL AT [phone] X [phone].   THE PATIENT IS ENCOURAGED TO PRACTICE SOCIAL DISTANCING DUE TO THE COVID-19 PANDEMIC.

## 2020-12-06 NOTE — Progress Notes (Signed)
For what? She needs to come in and make an appt

## 2021-01-28 ENCOUNTER — Other Ambulatory Visit: Payer: Self-pay | Admitting: Nurse Practitioner

## 2021-01-28 DIAGNOSIS — R059 Cough, unspecified: Secondary | ICD-10-CM

## 2021-02-08 ENCOUNTER — Other Ambulatory Visit: Payer: Self-pay | Admitting: Internal Medicine

## 2021-03-04 ENCOUNTER — Ambulatory Visit: Payer: BC Managed Care – PPO | Admitting: Internal Medicine

## 2021-03-20 ENCOUNTER — Ambulatory Visit: Payer: BC Managed Care – PPO | Admitting: Nurse Practitioner

## 2021-03-20 ENCOUNTER — Encounter: Payer: Self-pay | Admitting: Nurse Practitioner

## 2021-03-20 ENCOUNTER — Other Ambulatory Visit: Payer: Self-pay

## 2021-03-20 VITALS — BP 132/86 | HR 80 | Temp 98.9°F | Ht 63.6 in | Wt 152.6 lb

## 2021-03-20 DIAGNOSIS — F4321 Adjustment disorder with depressed mood: Secondary | ICD-10-CM

## 2021-03-20 NOTE — Patient Instructions (Signed)
Managing Loss, Adult People experience loss in many different ways throughout their lives. Events such as moving, changing jobs, and losing friends can create a sense of loss. The loss may be as serious as a major health change, divorce, death of a pet, or death of a loved one. All of these types of loss are likely to create a physical and emotional reaction known as grief. Grief is the result of a major change or an absence of something or someone that you count on. Grief is a normal reaction to loss. A variety of factors can affect your grieving experience, including: The nature of your loss. Your relationship to what or whom you lost. Your understanding of grief and how to manage it. Your support system. How to manage lifestyle changes Keep to your normal routine as much as possible. If you have trouble focusing or doing normal activities, it is acceptable to take some time away from your normal routine. Spend time with friends and loved ones. Eat a healthy diet, get plenty of sleep, and rest when you feel tired. How to recognize changes  The way that you deal with your grief will affect your ability to function as you normally do. When grieving, you may experience these changes: Numbness, shock, sadness, anxiety, anger, denial, and guilt. Thoughts about death. Unexpected crying. A physical sensation of emptiness in your stomach. Problems sleeping and eating. Tiredness (fatigue). Loss of interest in normal activities. Dreaming about or imagining seeing the person who died. A need to remember what or whom you lost. Difficulty thinking about anything other than your loss for a period of time. Relief. If you have been expecting the loss for a while, you may feel a sense of relief when it happens. Follow these instructions at home: Activity Express your feelings in healthy ways, such as: Talking with others about your loss. It may be helpful to find others who have had a similar loss, such  as a support group. Writing down your feelings in a journal. Doing physical activities to release stress and emotional energy. Doing creative activities like painting, sculpting, or playing or listening to music. Practicing resilience. This is the ability to recover and adjust after facing challenges. Reading some resources that encourage resilience may help you to learn ways to practice those behaviors.  General instructions Be patient with yourself and others. Allow the grieving process to happen, and remember that grieving takes time. It is likely that you may never feel completely done with some grief. You may find a way to move on while still cherishing memories and feelings about your loss. Accepting your loss is a process. It can take months or longer to adjust. Keep all follow-up visits as told by your health care provider. This is important. Where to find support To get support for managing loss: Ask your health care provider for help and recommendations, such as grief counseling or therapy. Think about joining a support group for people who are managing a loss. Where to find more information You can find more information about managing loss from: American Society of Clinical Oncology: www.cancer.net American Psychological Association: www.apa.org Contact a health care provider if: Your grief is extreme and keeps getting worse. You have ongoing grief that does not improve. Your body shows symptoms of grief, such as illness. You feel depressed, anxious, or lonely. Get help right away if: You have thoughts about hurting yourself or others. If you ever feel like you may hurt yourself or others, or have   thoughts about taking your own life, get help right away. You can go to your nearest emergency department or call: Your local emergency services (911 in the [place] A suicide crisis helpline, such as the National Suicide Prevention Lifeline at 1-800-273-8255. This is open 24 hours a  day. Summary Grief is the result of a major change or an absence of someone or something that you count on. Grief is a normal reaction to loss. The depth of grief and the period of recovery depend on the type of loss and your ability to adjust to the change and process your feelings. Processing grief requires patience and a willingness to accept your feelings and talk about your loss with people who are supportive. It is important to find resources that work for you and to realize that people experience grief differently. There is not one grieving process that works for everyone in the same way. Be aware that when grief becomes extreme, it can lead to more severe issues like isolation, depression, anxiety, or suicidal thoughts. Talk with your health care provider if you have any of these issues. This information is not intended to replace advice given to you by your health care provider. Make sure you discuss any questions you have with your health care provider. Document Revised: 12/07/2019 Document Reviewed: 12/07/2019 Elsevier Patient Education  2022 [employer]  

## 2021-03-20 NOTE — Progress Notes (Signed)
I,Katawbba Wiggins,acting as a Education administrator for [employer], NP.,have documented all relevant documentation on the behalf of [employer], NP,as directed by  Bary Castilla, NP while in the presence of Bary Castilla, NP.  This visit occurred during the SARS-CoV-2 public health emergency.  Safety protocols were in place, including screening questions prior to the visit, additional usage of staff PPE, and extensive cleaning of exam room while observing appropriate contact time as indicated for disinfecting solutions.  Subjective:     Patient ID: Candice Hughes , female    DOB: 12/19/1970 , 50 y.o.   MRN: [id number]   Chief Complaint  Patient presents with   FLMA forms    HPI  The patient is here today to complete FLMA forms. The patient's husband recently passed away on 17-Mar-2023. 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     Past Medical History:  Diagnosis Date   Acne 03/15/2014   Cancer [facility]) 2005   history of lymphoma-chest tumor   Headache(784.0)    History of blood transfusion 2005   surgery related- [place]   Hypertension      Family History  Problem Relation Age of Onset   Hypertension Mother    Hypertension Father    Hyperlipidemia Father    Diabetes Father      Current Outpatient Medications:    ALPRAZolam (XANAX) 0.25 MG tablet, Take 0.25 mg by mouth every 8 (eight) hours as needed., Disp: , Rfl:    Cholecalciferol (VITAMIN D-3 PO), Take 2,000 Units by mouth daily. , Disp: , Rfl:    estradiol (VIVELLE-DOT) 0.0375 MG/24HR, Place 1 patch onto the skin 2 (two) times a week., Disp: 24 patch, Rfl: 1   fexofenadine (ALLEGRA) 180 MG tablet, Take 180 mg by mouth daily., Disp: , Rfl:    fluticasone (FLONASE) 50 MCG/ACT nasal spray, SPRAY 1 SPRAY INTO EACH NOSTRIL EVERY DAY, Disp: 48 mL, Rfl: 1   ibuprofen (ADVIL,MOTRIN) 600 MG tablet, Take 1 tablet (600 mg total) by mouth every 6 (six) hours as needed (mild  pain)., Disp: 30 tablet, Rfl: 0   pravastatin (PRAVACHOL) 40 MG tablet, TAKE 1 TABLET BY MOUTH EVERY DAY IN THE EVENING, Disp: 90 tablet, Rfl: 1   spironolactone (ALDACTONE) 50 MG tablet, TAKE 1 TABLET BY MOUTH EVERY DAY IN THE MORNING, Disp: 90 tablet, Rfl: 1   Allergies  Allergen Reactions   Iodinated Diagnostic Agents Itching   Iohexol      Code: HIVES, Desc: hives, itching, itchy throat, premed w/ benadryl prior to scan, (done w/ 50mg  just prior to scan 05/24/08 w/ no probs dr hall approved)      Review of Systems  Constitutional: Negative.   Respiratory: Negative.    Cardiovascular: Negative.   Gastrointestinal: Negative.   Psychiatric/Behavioral: Negative.         Tearful   All other systems reviewed and are negative.   Today's Vitals   03/20/21 1004  BP: 132/86  Pulse: 80  Temp: 98.9 F (37.2 C)  TempSrc: Oral  Weight: 152 lb 9.6 oz (69.2 kg)  Height: 5' 3.6" (1.615 m)   Body mass index is 26.52 kg/m.  Wt Readings from Last 3 Encounters:  03/20/21 152 lb 9.6 oz (69.2 kg)  08/29/20 153 lb (69.4 kg)  01/04/20 154 lb 12.8 oz (70.2 kg)    BP Readings from Last 3 Encounters:  03/20/21 132/86  08/29/20 132/78  01/04/20 134/72  Objective:  Physical Exam Constitutional:      Appearance: Normal appearance.  Cardiovascular:     Rate and Rhythm: Normal rate and regular rhythm.     Pulses: Normal pulses.     Heart sounds: Normal heart sounds. No murmur heard. Pulmonary:     Effort: Pulmonary effort is normal. No respiratory distress.     Breath sounds: Normal breath sounds. No stridor. No wheezing.  Neurological:     Mental Status: She is alert.        Assessment And Plan:     1. Unresolved grief  -Pt. Brought with her FMLA paper work that she has requested to be filled out due to grief due to loss of her spouse. She is requesting time off from her work so she could go to counseling and therapy after his loss. FMLA was filled out and given to the patient.    Follow up: if symptoms persist or do not get better.   The patient was encouraged to call or send a message through [place] for any questions or concerns.   Side effects and appropriate use of all the medication(s) were discussed with the patient today. Patient advised to use the medication(s) as directed by their healthcare provider. The patient was encouraged to read, review, and understand all associated package inserts and contact our office with any questions or concerns. The patient accepts the risks of the treatment plan and had an opportunity to ask questions.   Staying healthy and adopting a healthy lifestyle for your overall health is important. You should eat 7 or more servings of fruits and vegetables per day. You should drink plenty of water to keep yourself hydrated and your kidneys healthy. This includes about 65-80+ fluid ounces of water. Limit your intake of animal fats especially for elevated cholesterol. Avoid highly processed food and limit your salt intake if you have hypertension. Avoid foods high in saturated/Trans fats. Along with a healthy diet it is also very important to maintain time for yourself to maintain a healthy mental health with low stress levels. You should get atleast 150 min of moderate intensity exercise weekly for a healthy heart. Along with eating right and exercising, aim for at least 7-9 hours of sleep daily.  Eat more whole grains which includes barley, wheat berries, oats, brown rice and whole wheat pasta. Use healthy plant oils which include olive, soy, corn, sunflower and peanut. Limit your caffeine and sugary drinks. Limit your intake of fast foods. Limit milk and dairy products to one or two daily servings.   Patient was given opportunity to ask questions. Patient verbalized understanding of the plan and was able to repeat key elements of the plan. All questions were answered to their satisfaction.  Raman Mihika Surrette, DNP   I, Raman Makensie Mulhall have reviewed  all documentation for this visit. The documentation on 03/20/21 for the exam, diagnosis, procedures, and orders are all accurate and complete.   IF YOU HAVE BEEN REFERRED TO A SPECIALIST, IT MAY TAKE 1-2 WEEKS TO SCHEDULE/PROCESS THE REFERRAL. IF YOU HAVE NOT HEARD FROM US/SPECIALIST IN TWO WEEKS, PLEASE GIVE [place] A CALL AT [phone] X [phone].   THE PATIENT IS ENCOURAGED TO PRACTICE SOCIAL DISTANCING DUE TO THE COVID-19 PANDEMIC.

## 2021-04-23 ENCOUNTER — Other Ambulatory Visit: Payer: Self-pay | Admitting: Internal Medicine

## 2021-09-02 ENCOUNTER — Other Ambulatory Visit: Payer: Self-pay

## 2021-09-02 ENCOUNTER — Encounter: Payer: Self-pay | Admitting: Internal Medicine

## 2021-09-02 ENCOUNTER — Ambulatory Visit (INDEPENDENT_AMBULATORY_CARE_PROVIDER_SITE_OTHER): Payer: BC Managed Care – PPO | Admitting: Internal Medicine

## 2021-09-02 VITALS — BP 118/74 | HR 90 | Temp 98.6°F | Ht 63.6 in | Wt 155.0 lb

## 2021-09-02 DIAGNOSIS — Z6379 Other stressful life events affecting family and household: Secondary | ICD-10-CM

## 2021-09-02 DIAGNOSIS — Z1231 Encounter for screening mammogram for malignant neoplasm of breast: Secondary | ICD-10-CM

## 2021-09-02 DIAGNOSIS — Z2821 Immunization not carried out because of patient refusal: Secondary | ICD-10-CM

## 2021-09-02 DIAGNOSIS — I10 Essential (primary) hypertension: Secondary | ICD-10-CM

## 2021-09-02 DIAGNOSIS — E7800 Pure hypercholesterolemia, unspecified: Secondary | ICD-10-CM

## 2021-09-02 DIAGNOSIS — Z0000 Encounter for general adult medical examination without abnormal findings: Secondary | ICD-10-CM

## 2021-09-02 LAB — POCT URINALYSIS DIPSTICK
Bilirubin, UA: NEGATIVE
Blood, UA: NEGATIVE
Glucose, UA: NEGATIVE
Ketones, UA: NEGATIVE
Leukocytes, UA: NEGATIVE
Nitrite, UA: NEGATIVE
Protein, UA: NEGATIVE
Spec Grav, UA: 1.015 (ref 1.010–1.025)
Urobilinogen, UA: 0.2 E.U./dL
pH, UA: 6 (ref 5.0–8.0)

## 2021-09-02 MED ORDER — PRAVASTATIN SODIUM 40 MG PO TABS: ORAL_TABLET | ORAL | 1 refills | Status: DC

## 2021-09-02 MED ORDER — SPIRONOLACTONE 50 MG PO TABS: ORAL_TABLET | ORAL | 1 refills | Status: DC

## 2021-09-02 NOTE — Progress Notes (Signed)
I,Tianna Badgett,acting as a Education administrator for Maximino Greenland, MD.,have documented all relevant documentation on the behalf of Maximino Greenland, MD,as directed by  Maximino Greenland, MD while in the presence of Maximino Greenland, MD.  This visit occurred during the SARS-CoV-2 public health emergency.  Safety protocols were in place, including screening questions prior to the visit, additional usage of staff PPE, and extensive cleaning of exam room while observing appropriate contact time as indicated for disinfecting solutions.  Subjective:     Patient ID: Candice Hughes , female    DOB: [birth year] , 51 y.o.   MRN: [id number]   Chief Complaint  Patient presents with   Annual Exam    HPI  She is here today for a full physical examination. She is followed by Dr. Louretta Shorten for her GYN exams. S/he reports compliance with meds. She denies headaches, chest pain and shortness of breath. She has no specific concerns or complaints at this time.   BP Readings from Last 3 Encounters: 09/02/21 : 118/74 03/20/21 : 132/86 08/29/20 : 132/78    Hypertension This is a chronic problem. The current episode started more than 1 year ago. The problem has been gradually improving since onset. The problem is controlled. Pertinent negatives include no blurred vision, chest pain, palpitations or shortness of breath. Past treatments include diuretics. The current treatment provides moderate improvement.    Past Medical History:  Diagnosis Date   Acne 03/15/2014   Cancer [facility]) 2005   history of lymphoma-chest tumor   Headache(784.0)    History of blood transfusion 2005   surgery related- [place]   Hypertension      Family History  Problem Relation Age of Onset   Hypertension Mother    Hypertension Father    Hyperlipidemia Father    Diabetes Father    Kidney failure Father      Current Outpatient Medications:    ALPRAZolam (XANAX) 0.25 MG tablet, Take 0.25 mg by mouth every 8 (eight) hours as needed.,  Disp: , Rfl:    Cholecalciferol (VITAMIN D-3 PO), Take 2,000 Units by mouth daily. , Disp: , Rfl:    estradiol (VIVELLE-DOT) 0.0375 MG/24HR, Place 1 patch onto the skin 2 (two) times a week., Disp: 24 patch, Rfl: 1   fexofenadine (ALLEGRA) 180 MG tablet, Take 180 mg by mouth daily., Disp: , Rfl:    fluticasone (FLONASE) 50 MCG/ACT nasal spray, SPRAY 1 SPRAY INTO EACH NOSTRIL EVERY DAY, Disp: 48 mL, Rfl: 1   ibuprofen (ADVIL,MOTRIN) 600 MG tablet, Take 1 tablet (600 mg total) by mouth every 6 (six) hours as needed (mild pain)., Disp: 30 tablet, Rfl: 0   pravastatin (PRAVACHOL) 40 MG tablet, TAKE 1 TABLET BY MOUTH EVERY DAY IN THE EVENING, Disp: 90 tablet, Rfl: 1   spironolactone (ALDACTONE) 50 MG tablet, TAKE 1 TABLET BY MOUTH EVERY DAY IN THE MORNING, Disp: 90 tablet, Rfl: 1   Allergies  Allergen Reactions   Iodinated Contrast Media Itching   Iohexol      Code: HIVES, Desc: hives, itching, itchy throat, premed w/ benadryl prior to scan, (done w/ 25m just prior to scan 05/24/08 w/ no probs dr hall approved)       The patient states she uses none for birth control. Last LMP was Patient's last menstrual period was 11/24/2011.. Negative for Dysmenorrhea. Negative for: breast discharge, breast lump(s), breast pain and breast self exam. Associated symptoms include abnormal vaginal bleeding. Pertinent negatives include abnormal bleeding (hematology),  anxiety, decreased libido, depression, difficulty falling sleep, dyspareunia, history of infertility, nocturia, sexual dysfunction, sleep disturbances, urinary incontinence, urinary urgency, vaginal discharge and vaginal itching. Diet regular.    . The patient's tobacco use is:  Social History   Tobacco Use  Smoking Status Never  Smokeless Tobacco Never  . She has been exposed to passive smoke. The patient's alcohol use is:  Social History   Substance and Sexual Activity  Alcohol Use Yes   Comment: socially    Review of Systems   Constitutional: Negative.   HENT: Negative.    Eyes: Negative.  Negative for blurred vision.  Respiratory: Negative.  Negative for shortness of breath.   Cardiovascular: Negative.  Negative for chest pain and palpitations.  Gastrointestinal: Negative.   Endocrine: Negative.   Genitourinary: Negative.   Musculoskeletal: Negative.   Skin: Negative.   Allergic/Immunologic: Negative.   Neurological: Negative.   Hematological: Negative.   Psychiatric/Behavioral:  The patient is nervous/anxious.        She admits she is under a lot of stress. She has a 19 -year old son who is giving her some trouble. His father passed in Sept 2022. He did not agree to grief counseling. She would like to establish care with a therapist.     Today's Vitals   09/02/21 1037  BP: 118/74  Pulse: 90  Temp: 98.6 F (37 C)  TempSrc: Oral  Weight: 155 lb (70.3 kg)  Height: 5' 3.6" (1.615 m)   Body mass index is 26.94 kg/m.  Wt Readings from Last 3 Encounters:  09/02/21 155 lb (70.3 kg)  03/20/21 152 lb 9.6 oz (69.2 kg)  08/29/20 153 lb (69.4 kg)    Objective:  Physical Exam Vitals and nursing note reviewed.  Constitutional:      Appearance: Normal appearance.  HENT:     Head: Normocephalic and atraumatic.     Right Ear: Tympanic membrane, ear canal and external ear normal.     Left Ear: Tympanic membrane, ear canal and external ear normal.     Nose:     Comments: Masked     Mouth/Throat:     Comments: Masked  Eyes:     Extraocular Movements: Extraocular movements intact.     Conjunctiva/sclera: Conjunctivae normal.     Pupils: Pupils are equal, round, and reactive to light.  Cardiovascular:     Rate and Rhythm: Normal rate and regular rhythm.     Pulses: Normal pulses.     Heart sounds: Normal heart sounds.  Pulmonary:     Effort: Pulmonary effort is normal.     Breath sounds: Normal breath sounds.  Chest:  Breasts:    Tanner Score is 5.     Right: Normal.     Left: Normal.   Abdominal:     General: Abdomen is flat. Bowel sounds are normal.     Palpations: Abdomen is soft.  Genitourinary:    Comments: deferred Musculoskeletal:        General: Normal range of motion.     Cervical back: Normal range of motion and neck supple.  Skin:    General: Skin is warm and dry.  Neurological:     General: No focal deficit present.     Mental Status: She is alert and oriented to person, place, and time.  Psychiatric:        Mood and Affect: Mood normal.        Behavior: Behavior normal.        Assessment  And Plan:     1. Routine general medical examination at health care facility Comments: A full exam was performed. Importance of monthly self breast exams was discussed with the patient. She agrees to referral for mammogram. PATIENT IS ADVISED TO GET 30-45 MINUTES REGULAR EXERCISE NO LESS THAN FOUR TO FIVE DAYS PER WEEK - BOTH WEIGHTBEARING EXERCISES AND AEROBIC ARE RECOMMENDED.  PATIENT IS ADVISED TO FOLLOW A HEALTHY DIET WITH AT LEAST SIX FRUITS/VEGGIES PER DAY, DECREASE INTAKE OF RED MEAT, AND TO INCREASE FISH INTAKE TO TWO DAYS PER WEEK.  MEATS/FISH SHOULD NOT BE FRIED, BAKED OR BROILED IS PREFERABLE.  IT IS ALSO IMPORTANT TO CUT BACK ON YOUR SUGAR INTAKE. PLEASE AVOID ANYTHING WITH ADDED SUGAR, CORN SYRUP OR OTHER SWEETENERS. IF YOU MUST USE A SWEETENER, YOU CAN TRY STEVIA. IT IS ALSO IMPORTANT TO AVOID ARTIFICIALLY SWEETENERS AND DIET BEVERAGES. LASTLY, I SUGGEST WEARING SPF 50 SUNSCREEN ON EXPOSED PARTS AND ESPECIALLY WHEN IN THE DIRECT SUNLIGHT FOR AN EXTENDED PERIOD OF TIME.  PLEASE AVOID FAST FOOD RESTAURANTS AND INCREASE YOUR WATER INTAKE.  - CBC - Hemoglobin A1c - CMP14+EGFR - Lipid panel - VITAMIN D 25 Hydroxy (Vit-D Deficiency, Fractures)  2. Essential hypertension, benign Comments: Chronic, well controlled. EKG performed, NSR w/o acute changes. No med changes. She will c/w spironolactone, this medication is preferred b/c it also helps her with her Derm  issues. She is encouraged to follow low sodium diet. She will f/u in six months.  - POCT Urinalysis Dipstick (81002) - Microalbumin / Creatinine Urine Ratio - EKG 12-Lead - spironolactone (ALDACTONE) 50 MG tablet; TAKE 1 TABLET BY MOUTH EVERY DAY IN THE MORNING  Dispense: 90 tablet; Refill: 1  3. Pure hypercholesterolemia Comments: Chronic, well controlled. She will c/w pravastatin daily. She is encouraged to avoid fried foods, exercise regularly and c/w current meds. F/u 6 months.  - pravastatin (PRAVACHOL) 40 MG tablet; TAKE 1 TABLET BY MOUTH EVERY DAY IN THE EVENING  Dispense: 90 tablet; Refill: 1  4. Stressful life event affecting family Comments: I will place referral to [facility] group for therapy as requested.  - Ambulatory referral to Psychology  5. Encounter for screening mammogram for malignant neoplasm of breast Comments: She agrees to referral to [facility] for annual breast cancer screening.  - MM DIGITAL SCREENING BILATERAL; Future  6. Influenza vaccination declined  Patient was given opportunity to ask questions. Patient verbalized understanding of the plan and was able to repeat key elements of the plan. All questions were answered to their satisfaction.   I, Maximino Greenland, MD, have reviewed all documentation for this visit. The documentation on 09/02/21 for the exam, diagnosis, procedures, and orders are all accurate and complete.   THE PATIENT IS ENCOURAGED TO PRACTICE SOCIAL DISTANCING DUE TO THE COVID-19 PANDEMIC.

## 2021-09-02 NOTE — Patient Instructions (Addendum)
Dr. Darleene Cleaver - female psychiatrist ? ?Health Maintenance, Female ?Adopting a healthy lifestyle and getting preventive care are important in promoting health and wellness. Ask your health care provider about: ?The right schedule for you to have regular tests and exams. ?Things you can do on your own to prevent diseases and keep yourself healthy. ?What should I know about diet, weight, and exercise? ?Eat a healthy diet ? ?Eat a diet that includes plenty of vegetables, fruits, low-fat dairy products, and lean protein. ?Do not eat a lot of foods that are high in solid fats, added sugars, or sodium. ?Maintain a healthy weight ?Body mass index (BMI) is used to identify weight problems. It estimates body fat based on height and weight. Your health care provider can help determine your BMI and help you achieve or maintain a healthy weight. ?Get regular exercise ?Get regular exercise. This is one of the most important things you can do for your health. Most adults should: ?Exercise for at least 150 minutes each week. The exercise should increase your heart rate and make you sweat (moderate-intensity exercise). ?Do strengthening exercises at least twice a week. This is in addition to the moderate-intensity exercise. ?Spend less time sitting. Even light physical activity can be beneficial. ?Watch cholesterol and blood lipids ?Have your blood tested for lipids and cholesterol at 53 years of age, then have this test every 5 years. ?Have your cholesterol levels checked more often if: ?Your lipid or cholesterol levels are high. ?You are older than 53 years of age. ?You are at high risk for heart disease. ?What should I know about cancer screening? ?Depending on your health history and family history, you may need to have cancer screening at various ages. This may include screening for: ?Breast cancer. ?Cervical cancer. ?Colorectal cancer. ?Skin cancer. ?Lung cancer. ?What should I know about heart disease, diabetes, and high blood  pressure? ?Blood pressure and heart disease ?High blood pressure causes heart disease and increases the risk of stroke. This is more likely to develop in people who have high blood pressure readings or are overweight. ?Have your blood pressure checked: ?Every 3-5 years if you are 86-84 years of age. ?Every year if you are 108 years old or older. ?Diabetes ?Have regular diabetes screenings. This checks your fasting blood sugar level. Have the screening done: ?Once every three years after age 41 if you are at a normal weight and have a low risk for diabetes. ?More often and at a younger age if you are overweight or have a high risk for diabetes. ?What should I know about preventing infection? ?Hepatitis B ?If you have a higher risk for hepatitis B, you should be screened for this virus. Talk with your health care provider to find out if you are at risk for hepatitis B infection. ?Hepatitis C ?Testing is recommended for: ?Everyone born from [birth year] through 1967. ?Anyone with known risk factors for hepatitis C. ?Sexually transmitted infections (STIs) ?Get screened for STIs, including gonorrhea and chlamydia, if: ?You are sexually active and are younger than 53 years of age. ?You are older than 53 years of age and your health care provider tells you that you are at risk for this type of infection. ?Your sexual activity has changed since you were last screened, and you are at increased risk for chlamydia or gonorrhea. Ask your health care provider if you are at risk. ?Ask your health care provider about whether you are at high risk for HIV. Your health care provider may recommend a  prescription medicine to help prevent HIV infection. If you choose to take medicine to prevent HIV, you should first get tested for HIV. You should then be tested every 3 months for as long as you are taking the medicine. ?Pregnancy ?If you are about to stop having your period (premenopausal) and you may become pregnant, seek counseling before you  get pregnant. ?Take 400 to 800 micrograms (mcg) of folic acid every day if you become pregnant. ?Ask for birth control (contraception) if you want to prevent pregnancy. ?Osteoporosis and menopause ?Osteoporosis is a disease in which the bones lose minerals and strength with aging. This can result in bone fractures. If you are 38 years old or older, or if you are at risk for osteoporosis and fractures, ask your health care provider if you should: ?Be screened for bone loss. ?Take a calcium or vitamin D supplement to lower your risk of fractures. ?Be given hormone replacement therapy (HRT) to treat symptoms of menopause. ?Follow these instructions at home: ?Alcohol use ?Do not drink alcohol if: ?Your health care provider tells you not to drink. ?You are pregnant, may be pregnant, or are planning to become pregnant. ?If you drink alcohol: ?Limit how much you have to: ?0-1 drink a day. ?Know how much alcohol is in your drink. In the [place] one drink equals one 12 oz bottle of beer (355 mL), one 5 oz glass of wine (148 mL), or one 1? oz glass of hard liquor (44 mL). ?Lifestyle ?Do not use any products that contain nicotine or tobacco. These products include cigarettes, chewing tobacco, and vaping devices, such as e-cigarettes. If you need help quitting, ask your health care provider. ?Do not use street drugs. ?Do not share needles. ?Ask your health care provider for help if you need support or information about quitting drugs. ?General instructions ?Schedule regular health, dental, and eye exams. ?Stay current with your vaccines. ?Tell your health care provider if: ?You often feel depressed. ?You have ever been abused or do not feel safe at home. ?Summary ?Adopting a healthy lifestyle and getting preventive care are important in promoting health and wellness. ?Follow your health care provider's instructions about healthy diet, exercising, and getting tested or screened for diseases. ?Follow your health care provider's  instructions on monitoring your cholesterol and blood pressure. ?This information is not intended to replace advice given to you by your health care provider. Make sure you discuss any questions you have with your health care provider. ?Document Revised: 11/04/2020 Document Reviewed: 11/04/2020 ?Elsevier Patient Education ? [place]. ? ?

## 2021-09-03 LAB — CMP14+EGFR
ALT: 17 IU/L (ref 0–32)
AST: 18 IU/L (ref 0–40)
Albumin/Globulin Ratio: 2.2 (ref 1.2–2.2)
Albumin: 4.8 g/dL (ref 3.8–4.9)
Alkaline Phosphatase: 67 IU/L (ref 44–121)
BUN/Creatinine Ratio: 13 (ref 9–23)
BUN: 11 mg/dL (ref 6–24)
Bilirubin Total: 0.3 mg/dL (ref 0.0–1.2)
CO2: 25 mmol/L (ref 20–29)
Calcium: 10.2 mg/dL (ref 8.7–10.2)
Chloride: 102 mmol/L (ref 96–106)
Creatinine, Ser: 0.82 mg/dL (ref 0.57–1.00)
Globulin, Total: 2.2 g/dL (ref 1.5–4.5)
Glucose: 96 mg/dL (ref 70–99)
Potassium: 5 mmol/L (ref 3.5–5.2)
Sodium: 142 mmol/L (ref 134–144)
Total Protein: 7 g/dL (ref 6.0–8.5)
eGFR: 86 mL/min/{1.73_m2} (ref >59)

## 2021-09-03 LAB — CBC
Hematocrit: 39.8 % (ref 34.0–46.6)
Hemoglobin: 13.4 g/dL (ref 11.1–15.9)
MCH: 29.3 pg (ref 26.6–33.0)
MCHC: 33.7 g/dL (ref 31.5–35.7)
MCV: 87 fL (ref 79–97)
Platelets: 276 10*3/uL (ref 150–450)
RBC: 4.57 x10E6/uL (ref 3.77–5.28)
RDW: 12.6 % (ref 11.7–15.4)
WBC: 4.5 10*3/uL (ref 3.4–10.8)

## 2021-09-03 LAB — LIPID PANEL
Chol/HDL Ratio: 2.9 ratio (ref 0.0–4.4)
Cholesterol, Total: 185 mg/dL (ref 100–199)
HDL: 63 mg/dL (ref >39)
LDL Chol Calc (NIH): 107 mg/dL — ABNORMAL HIGH (ref 0–99)
Triglycerides: 82 mg/dL (ref 0–149)
VLDL Cholesterol Cal: 15 mg/dL (ref 5–40)

## 2021-09-03 LAB — MICROALBUMIN / CREATININE URINE RATIO
Creatinine, Urine: 128.8 mg/dL
Microalb/Creat Ratio: 4 mg/g creat (ref 0–29)
Microalbumin, Urine: 5.3 ug/mL

## 2021-09-03 LAB — HEMOGLOBIN A1C
Est. average glucose Bld gHb Est-mCnc: 120 mg/dL
Hgb A1c MFr Bld: 5.8 % — ABNORMAL HIGH (ref 4.8–5.6)

## 2021-09-03 LAB — VITAMIN D 25 HYDROXY (VIT D DEFICIENCY, FRACTURES): Vit D, 25-Hydroxy: 69.3 ng/mL (ref 30.0–100.0)

## 2021-09-18 ENCOUNTER — Encounter: Payer: Self-pay | Admitting: Internal Medicine

## 2021-09-25 ENCOUNTER — Ambulatory Visit
Admission: RE | Admit: 2021-09-25 | Discharge: 2021-09-25 | Disposition: A | Discharge status: Home / Self Care | Payer: BC Managed Care – PPO | Source: Ambulatory Visit | Attending: Internal Medicine | Admitting: Internal Medicine

## 2021-09-25 DIAGNOSIS — Z1231 Encounter for screening mammogram for malignant neoplasm of breast: Secondary | ICD-10-CM

## 2021-11-10 ENCOUNTER — Encounter: Payer: Self-pay | Admitting: Internal Medicine

## 2022-03-09 ENCOUNTER — Ambulatory Visit: Payer: BC Managed Care – PPO | Admitting: Internal Medicine

## 2022-03-13 ENCOUNTER — Ambulatory Visit (HOSPITAL_COMMUNITY)
Admission: EM | Admit: 2022-03-13 | Discharge: 2022-03-13 | Disposition: A | Discharge status: Home / Self Care | Payer: BC Managed Care – PPO

## 2022-03-13 ENCOUNTER — Encounter (HOSPITAL_COMMUNITY): Payer: Self-pay

## 2022-03-13 DIAGNOSIS — R42 Dizziness and giddiness: Secondary | ICD-10-CM

## 2022-03-13 DIAGNOSIS — F419 Anxiety disorder, unspecified: Secondary | ICD-10-CM

## 2022-03-13 DIAGNOSIS — I10 Essential (primary) hypertension: Secondary | ICD-10-CM | POA: Diagnosis not present

## 2022-03-13 NOTE — ED Provider Notes (Signed)
[place]    CSN: [id number] Arrival date & time: 03/13/22  1051      History   Chief Complaint Chief Complaint  Patient presents with   Dizziness   Hypertension   Panic Attack    HPI Candice Hughes is a 51 y.o. female.   Patient presents urgent care for evaluation of anxiety and increased stress causing recent panic attacks.  Most recent panic attack was on Wednesday, March 11, 2022 (2 days ago).  States that her husband passed away approximately 1 year ago and her dad passed away 1 month after her husband last year.  She believes that her recent panic symptoms and increased anxiety are related to the 1 year anniversary of these losses.  After her last panic attack, she called EMS who evaluated her and found her to have an elevated blood pressure.  She is concerned that her dizziness may be related to her elevated blood pressure.  She currently takes spironolactone for elevated blood pressure.  Noticed that her blood pressure was elevated to the 140s over 110s a couple of days ago and became concerned.  Dizziness comes and goes and feels like a boat rocking sensation.  Patient denies room spinning sensation and is not currently dizzy at this time.  No association of dizziness with eating or lack of food.  She is not diabetic.  Denies chest pain, shortness of breath, and weakness.  Denies blurry vision, headache, nausea, vomiting, back pain, abdominal pain, urinary symptoms, recent falls/injuries, and fever/chills.  Denies loss of SI/HI.  She has been taking leftover alprazolam prescribed by OB/GYN years ago after a miscarriage for symptoms without much relief of anxiety symptoms.  She does not currently take any daily medications for anxiety/depression and is currently in the process of attempting to establish care with a therapist.     Dizziness Hypertension    Past Medical History:  Diagnosis Date   Acne 03/15/2014   Cancer ([facility]) 2005   history of lymphoma-chest  tumor   Headache(784.0)    History of blood transfusion 2005   surgery related- [place]   Hypertension     Patient Active Problem List   Diagnosis Date Noted   Chronic right shoulder pain 09/04/2020   Essential hypertension, benign 08/28/2018   Other abnormal glucose 08/28/2018   Pure hypercholesterolemia 08/28/2018   Female climacteric state 08/28/2018   Acute pharyngitis 07/07/2018   Cough 07/07/2018   Hypertensive disorder 07/07/2018   Acne 03/15/2014   Upper respiratory symptom 03/15/2014   H/O non-Hodgkin's lymphoma 02/09/2013    Past Surgical History:  Procedure Laterality Date   ABDOMINAL HYSTERECTOMY  12/21/2011   Procedure: HYSTERECTOMY ABDOMINAL;  Surgeon: Luz Lex, MD;  Location: [facility];  Service: Gynecology;  Laterality: N/A;  left Oophorectomy   CHEST WALL BIOPSY  2005   DILATION AND CURETTAGE OF UTERUS  2005   x2   MYOMECTOMY  2000    OB History   No obstetric history on file.      Home Medications    Prior to Admission medications   Medication Sig Start Date End Date Taking? Authorizing Provider  ALPRAZolam (XANAX) 0.25 MG tablet Take 0.25 mg by mouth every 8 (eight) hours as needed. 08/02/20  Yes [provider]  Cholecalciferol (VITAMIN D-3 PO) Take 2,000 Units by mouth daily.    Yes [provider]  estradiol (VIVELLE-DOT) 0.0375 MG/24HR Place 1 patch onto the skin 2 (two) times a week. 08/29/20  Yes  Glendale Chard, MD  fexofenadine (ALLEGRA) 180 MG tablet Take 180 mg by mouth daily.   Yes [provider]  pravastatin (PRAVACHOL) 40 MG tablet TAKE 1 TABLET BY MOUTH EVERY DAY IN THE EVENING 09/02/21  Yes Glendale Chard, MD  spironolactone (ALDACTONE) 50 MG tablet TAKE 1 TABLET BY MOUTH EVERY DAY IN THE MORNING 09/02/21  Yes Glendale Chard, MD  fluticasone [facility]) 50 MCG/ACT nasal spray SPRAY 1 SPRAY INTO EACH NOSTRIL EVERY DAY 02/10/21   Glendale Chard, MD  ibuprofen (ADVIL,MOTRIN) 600 MG tablet Take 1 tablet (600 mg total)  by mouth every 6 (six) hours as needed (mild pain). 12/23/11   Louretta Shorten, MD    Family History Family History  Problem Relation Age of Onset   Hypertension Mother    Hypertension Father    Hyperlipidemia Father    Diabetes Father    Kidney failure Father     Social History Social History   Tobacco Use   Smoking status: Never   Smokeless tobacco: Never  Vaping Use   Vaping Use: Never used  Substance Use Topics   Alcohol use: Yes    Comment: socially   Drug use: No     Allergies   Iodinated contrast media and Iohexol   Review of Systems Review of Systems  Neurological:  Positive for dizziness.  Per HPI   Physical Exam Triage Vital Signs ED Triage Vitals  Enc Vitals Group     BP 03/13/22 1239 (!) 137/90     Pulse Rate 03/13/22 1239 74     Resp 03/13/22 1239 16     Temp 03/13/22 1239 98.1 F (36.7 C)     Temp Source 03/13/22 1239 Oral     SpO2 03/13/22 1239 95 %     Weight --      Height --      Head Circumference --      Peak Flow --      Pain Score 03/13/22 1242 0     Pain Loc --      Pain Edu? --      Excl. in [place]? --    No data found.  Updated Vital Signs BP (!) 137/90 (BP Location: Left Arm)   Pulse 74   Temp 98.1 F (36.7 C) (Oral)   Resp 16   LMP 11/24/2011   SpO2 95%   Visual Acuity Right Eye Distance:   Left Eye Distance:   Bilateral Distance:    Right Eye Near:   Left Eye Near:    Bilateral Near:     Physical Exam Vitals and nursing note reviewed.  Constitutional:      Appearance: Normal appearance. She is not ill-appearing or toxic-appearing.     Comments: Very pleasant patient sitting on exam in position of comfort table in no acute distress.   HENT:     Head: Normocephalic and atraumatic.     Right Ear: Hearing and external ear normal.     Left Ear: Hearing and external ear normal.     Nose: Nose normal.     Mouth/Throat:     Lips: Pink.     Mouth: Mucous membranes are moist.  Eyes:     General: Lids are normal.  Vision grossly intact. Gaze aligned appropriately.        Right eye: No discharge.        Left eye: No discharge.     Extraocular Movements: Extraocular movements intact.     Conjunctiva/sclera: Conjunctivae normal.  Cardiovascular:     Rate and Rhythm: Regular rhythm.     Heart sounds: Normal heart sounds, S1 normal and S2 normal.  Pulmonary:     Effort: Pulmonary effort is normal. No respiratory distress.     Breath sounds: Normal breath sounds and air entry.  Abdominal:     Palpations: Abdomen is soft.  Musculoskeletal:     Cervical back: Neck supple.  Skin:    General: Skin is warm and dry.     Capillary Refill: Capillary refill takes less than 2 seconds.     Findings: No rash.  Neurological:     General: No focal deficit present.     Mental Status: She is alert and oriented to person, place, and time. Mental status is at baseline.     Cranial Nerves: No dysarthria or facial asymmetry.     Motor: No weakness.     Gait: Gait is intact. Gait normal.     Comments: Nonfocal neuro exam.  Psychiatric:        Mood and Affect: Mood normal.        Speech: Speech normal.        Behavior: Behavior normal.        Thought Content: Thought content normal.        Judgment: Judgment normal.      UC Treatments / Results  Labs (all labs ordered are listed, but only abnormal results are displayed) Labs Reviewed - No data to display  EKG   Radiology No results found.  Procedures Procedures (including critical care time)  Medications Ordered in UC Medications - No data to display  Initial Impression / Assessment and Plan / UC Course  I have reviewed the triage vital signs and the nursing notes.  Pertinent labs & imaging results that were available during my care of the patient were reviewed by me and considered in my medical decision making (see chart for details).   1.  Anxiety Patient's symptoms are likely physical manifestations  *** Final Clinical Impressions(s) / UC  Diagnoses   Final diagnoses:  Anxiety  Hypertension, unspecified type  Dizziness     Discharge Instructions      Continue to take to take alprazolam as needed for anxiety.  To the [facility] behavioral health urgent care for further evaluation prior to appointment with PCP in October as I believe you would benefit from daily medication to help with your anxiety and other symptoms.   Rest and take time for yourself to grieve and heal.  If your symptoms are severe, please go to the emergency room.  Follow-up with your primary care provider for further evaluation and management of your symptoms as well as ongoing wellness visits.  I hope you feel better!     ED Prescriptions   None    I have reviewed the PDMP during this encounter.

## 2022-03-13 NOTE — ED Triage Notes (Signed)
Panic attack last Wednesday, Dizziness the day before and elevated blood pressure.  Patient states she has added stress. Patient lost her balance and fell due to dizziness Tuesday before last, no injures.   Patient taking Alprazolam prn but this not helping.

## 2022-03-13 NOTE — Discharge Instructions (Addendum)
Continue to take to take alprazolam as needed for anxiety.  To the [facility] behavioral health urgent care for further evaluation prior to appointment with PCP in October as I believe you would benefit from daily medication to help with your anxiety and other symptoms.   Rest and take time for yourself to grieve and heal.  If your symptoms are severe, please go to the emergency room.  Follow-up with your primary care provider for further evaluation and management of your symptoms as well as ongoing wellness visits.  I hope you feel better!

## 2022-03-19 ENCOUNTER — Other Ambulatory Visit: Payer: Self-pay | Admitting: Internal Medicine

## 2022-03-19 DIAGNOSIS — E7800 Pure hypercholesterolemia, unspecified: Secondary | ICD-10-CM

## 2022-03-19 DIAGNOSIS — I10 Essential (primary) hypertension: Secondary | ICD-10-CM

## 2022-04-14 ENCOUNTER — Encounter: Payer: Self-pay | Admitting: Internal Medicine

## 2022-04-14 ENCOUNTER — Ambulatory Visit: Payer: BC Managed Care – PPO | Admitting: Internal Medicine

## 2022-04-14 VITALS — BP 130/82 | HR 90 | Temp 98.6°F | Ht 63.6 in | Wt 152.4 lb

## 2022-04-14 DIAGNOSIS — R7309 Other abnormal glucose: Secondary | ICD-10-CM

## 2022-04-14 DIAGNOSIS — I10 Essential (primary) hypertension: Secondary | ICD-10-CM

## 2022-04-14 DIAGNOSIS — F4323 Adjustment disorder with mixed anxiety and depressed mood: Secondary | ICD-10-CM | POA: Diagnosis not present

## 2022-04-14 DIAGNOSIS — Z6826 Body mass index (BMI) 26.0-26.9, adult: Secondary | ICD-10-CM

## 2022-04-14 DIAGNOSIS — Z2821 Immunization not carried out because of patient refusal: Secondary | ICD-10-CM

## 2022-04-14 LAB — HEMOGLOBIN A1C
Est. average glucose Bld gHb Est-mCnc: 123 mg/dL
Hgb A1c MFr Bld: 5.9 % — ABNORMAL HIGH (ref 4.8–5.6)

## 2022-04-14 LAB — CMP14+EGFR
ALT: 23 IU/L (ref 0–32)
AST: 21 IU/L (ref 0–40)
Albumin/Globulin Ratio: 1.8 (ref 1.2–2.2)
Albumin: 4.6 g/dL (ref 3.8–4.9)
Alkaline Phosphatase: 72 IU/L (ref 44–121)
BUN/Creatinine Ratio: 18 (ref 9–23)
BUN: 15 mg/dL (ref 6–24)
Bilirubin Total: 0.4 mg/dL (ref 0.0–1.2)
CO2: 27 mmol/L (ref 20–29)
Calcium: 10.2 mg/dL (ref 8.7–10.2)
Chloride: 100 mmol/L (ref 96–106)
Creatinine, Ser: 0.83 mg/dL (ref 0.57–1.00)
Globulin, Total: 2.6 g/dL (ref 1.5–4.5)
Glucose: 90 mg/dL (ref 70–99)
Potassium: 5 mmol/L (ref 3.5–5.2)
Sodium: 139 mmol/L (ref 134–144)
Total Protein: 7.2 g/dL (ref 6.0–8.5)
eGFR: 84 mL/min/{1.73_m2} (ref >59)

## 2022-04-14 MED ORDER — ALPRAZOLAM 0.25 MG PO TABS: 0.2500 mg | ORAL_TABLET | Freq: Every day | ORAL | 0 refills | Status: DC | PRN

## 2022-04-14 MED ORDER — ESCITALOPRAM OXALATE 10 MG PO TABS: 10.0000 mg | ORAL_TABLET | Freq: Every day | ORAL | 2 refills | Status: DC

## 2022-04-14 NOTE — Progress Notes (Signed)
Rich Brave [place],acting as a Education administrator for Maximino Greenland, MD.,have documented all relevant documentation on the behalf of Maximino Greenland, MD,as directed by  Maximino Greenland, MD while in the presence of Maximino Greenland, MD.    Subjective:     Patient ID: Candice Hughes , female    DOB: 11-26-1970 , 51 y.o.   MRN: [id number]   Chief Complaint  Patient presents with   Hypertension    HPI  Patient presents today for 6 month f/u on her bp. She reports compliance with meds. She denies headaches, chest pain and shortness of breath.   She would also like to discuss some depression and have her xanax refilled. She has been having difficulty with her son, who she reports has several mental health diagnoses.   Hypertension This is a chronic problem. The current episode started more than 1 year ago. The problem has been gradually improving since onset. The problem is controlled. Pertinent negatives include no blurred vision, chest pain, palpitations or shortness of breath. The current treatment provides moderate improvement.     Past Medical History:  Diagnosis Date   Acne 03/15/2014   Cancer [facility]) 2005   history of lymphoma-chest tumor   Headache(784.0)    History of blood transfusion 2005   surgery related- [place]   Hypertension      Family History  Problem Relation Age of Onset   Hypertension Mother    Hypertension Father    Hyperlipidemia Father    Diabetes Father    Kidney failure Father      Current Outpatient Medications:    Cholecalciferol (VITAMIN D-3 PO), Take 2,000 Units by mouth daily. , Disp: , Rfl:    escitalopram (LEXAPRO) 10 MG tablet, Take 1 tablet (10 mg total) by mouth daily., Disp: 30 tablet, Rfl: 2   estradiol (VIVELLE-DOT) 0.0375 MG/24HR, Place 1 patch onto the skin 2 (two) times a week., Disp: 24 patch, Rfl: 1   fexofenadine (ALLEGRA) 180 MG tablet, Take 180 mg by mouth daily., Disp: , Rfl:    fluticasone (FLONASE) 50 MCG/ACT nasal spray, SPRAY 1  SPRAY INTO EACH NOSTRIL EVERY DAY, Disp: 48 mL, Rfl: 1   ibuprofen (ADVIL,MOTRIN) 600 MG tablet, Take 1 tablet (600 mg total) by mouth every 6 (six) hours as needed (mild pain)., Disp: 30 tablet, Rfl: 0   latanoprost (XALATAN) 0.005 % ophthalmic solution, INSTILL 1 DROP INTO BOTH EYES EVERY DAY AT NIGHT, Disp: , Rfl:    pravastatin (PRAVACHOL) 40 MG tablet, TAKE 1 TABLET BY MOUTH EVERY DAY IN THE EVENING, Disp: 90 tablet, Rfl: 1   spironolactone (ALDACTONE) 50 MG tablet, TAKE 1 TABLET BY MOUTH EVERY DAY IN THE MORNING, Disp: 90 tablet, Rfl: 1   ALPRAZolam (XANAX) 0.25 MG tablet, Take 1 tablet (0.25 mg total) by mouth daily as needed., Disp: 30 tablet, Rfl: 0   Allergies  Allergen Reactions   Iodinated Contrast Media Itching   Iohexol      Code: HIVES, Desc: hives, itching, itchy throat, premed w/ benadryl prior to scan, (done w/ 66m just prior to scan 05/24/08 w/ no probs dr hall approved)      Review of Systems  Constitutional: Negative.   Eyes:  Negative for blurred vision.  Respiratory: Negative.  Negative for shortness of breath.   Cardiovascular: Negative.  Negative for chest pain and palpitations.  Gastrointestinal: Negative.   Neurological: Negative.   Psychiatric/Behavioral:  Positive for dysphoric mood. The patient is nervous/anxious.  Today's Vitals   04/14/22 1423  BP: 130/82  Pulse: 90  Temp: 98.6 F (37 C)  Weight: 152 lb 6.4 oz (69.1 kg)  Height: 5' 3.6" (1.615 m)  PainSc: 0-No pain   Body mass index is 26.49 kg/m.  Wt Readings from Last 3 Encounters:  04/14/22 152 lb 6.4 oz (69.1 kg)  09/02/21 155 lb (70.3 kg)  03/20/21 152 lb 9.6 oz (69.2 kg)     Objective:  Physical Exam Vitals and nursing note reviewed.  Constitutional:      Appearance: Normal appearance.  HENT:     Head: Normocephalic and atraumatic.     Nose:     Comments: Masked     Mouth/Throat:     Comments: Masked  Eyes:     Extraocular Movements: Extraocular movements intact.   Cardiovascular:     Rate and Rhythm: Normal rate and regular rhythm.     Heart sounds: Normal heart sounds.  Pulmonary:     Effort: Pulmonary effort is normal.     Breath sounds: Normal breath sounds.  Musculoskeletal:     Cervical back: Normal range of motion.  Skin:    General: Skin is warm.  Neurological:     General: No focal deficit present.     Mental Status: She is alert.  Psychiatric:        Mood and Affect: Mood normal.        Behavior: Behavior normal.       Assessment And Plan:     1. Essential hypertension, benign Comments: Chronic, controlled.  I will check renal function today. She will f/u in March 2024 for her next physical exam.  - CMP14+EGFR  2. Adjustment disorder with mixed anxiety and depressed mood Comments: She will c/w therapy at [facility] group. I will send rx escitalopram 70m nightly. She will f/u in six weeks for re-evaluation. I will renew alprazolam to use prn, originally prescribed by GYN. She is reminded this is a prn medication. If she needs chronically, I will refer her to Psych for further evaluation.  3. Other abnormal glucose Comments: Her a1c has been elevated in the past. I will recheck an a1c today. She is encouraged to limit her intake of sugary beverages and foods.  - Hemoglobin A1c  4. BMI 26.0-26.9,adult Comments: She is encouraged to aim for at least 150 minutes of exercise per week.   5. Influenza vaccination declined    Patient was given opportunity to ask questions. Patient verbalized understanding of the plan and was able to repeat key elements of the plan. All questions were answered to their satisfaction.   I, RMaximino Greenland MD, have reviewed all documentation for this visit. The documentation on 04/14/22 for the exam, diagnosis, procedures, and orders are all accurate and complete.   IF YOU HAVE BEEN REFERRED TO A SPECIALIST, IT MAY TAKE 1-2 WEEKS TO SCHEDULE/PROCESS THE REFERRAL. IF YOU HAVE NOT HEARD FROM US/SPECIALIST IN  TWO WEEKS, PLEASE GIVE [place] CALL AT [phone].   THE PATIENT IS ENCOURAGED TO PRACTICE SOCIAL DISTANCING DUE TO THE COVID-19 PANDEMIC.

## 2022-04-14 NOTE — Patient Instructions (Addendum)
Magnesium glycinate nightly  Hypertension, Adult Hypertension is another name for high blood pressure. High blood pressure forces your heart to work harder to pump blood. This can cause problems over time. There are two numbers in a blood pressure reading. There is a top number (systolic) over a bottom number (diastolic). It is best to have a blood pressure that is below 120/80. What are the causes? The cause of this condition is not known. Some other conditions can lead to high blood pressure. What increases the risk? Some lifestyle factors can make you more likely to develop high blood pressure: Smoking. Not getting enough exercise or physical activity. Being overweight. Having too much fat, sugar, calories, or salt (sodium) in your diet. Drinking too much alcohol. Other risk factors include: Having any of these conditions: Heart disease. Diabetes. High cholesterol. Kidney disease. Obstructive sleep apnea. Having a family history of high blood pressure and high cholesterol. Age. The risk increases with age. Stress. What are the signs or symptoms? High blood pressure may not cause symptoms. Very high blood pressure (hypertensive crisis) may cause: Headache. Fast or uneven heartbeats (palpitations). Shortness of breath. Nosebleed. Vomiting or feeling like you may vomit (nauseous). Changes in how you see. Very bad chest pain. Feeling dizzy. Seizures. How is this treated? This condition is treated by making healthy lifestyle changes, such as: Eating healthy foods. Exercising more. Drinking less alcohol. Your doctor may prescribe medicine if lifestyle changes do not help enough and if: Your top number is above 130. Your bottom number is above 80. Your personal target blood pressure may vary. Follow these instructions at home: Eating and drinking  If told, follow the DASH eating plan. To follow this plan: Fill one half of your plate at each meal with fruits and  vegetables. Fill one fourth of your plate at each meal with whole grains. Whole grains include whole-wheat pasta, brown rice, and whole-grain bread. Eat or drink low-fat dairy products, such as skim milk or low-fat yogurt. Fill one fourth of your plate at each meal with low-fat (lean) proteins. Low-fat proteins include fish, chicken without skin, eggs, beans, and tofu. Avoid fatty meat, cured and processed meat, or chicken with skin. Avoid pre-made or processed food. Limit the amount of salt in your diet to less than 1,500 mg each day. Do not drink alcohol if: Your doctor tells you not to drink. You are pregnant, may be pregnant, or are planning to become pregnant. If you drink alcohol: Limit how much you have to: 0-1 drink a day for women. 0-2 drinks a day for men. Know how much alcohol is in your drink. In the [place] one drink equals one 12 oz bottle of beer (355 mL), one 5 oz glass of wine (148 mL), or one 1 oz glass of hard liquor (44 mL). Lifestyle  Work with your doctor to stay at a healthy weight or to lose weight. Ask your doctor what the best weight is for you. Get at least 30 minutes of exercise that causes your heart to beat faster (aerobic exercise) most days of the week. This may include walking, swimming, or biking. Get at least 30 minutes of exercise that strengthens your muscles (resistance exercise) at least 3 days a week. This may include lifting weights or doing Pilates. Do not smoke or use any products that contain nicotine or tobacco. If you need help quitting, ask your doctor. Check your blood pressure at home as told by your doctor. Keep all follow-up visits. Medicines Take  over-the-counter and prescription medicines only as told by your doctor. Follow directions carefully. Do not skip doses of blood pressure medicine. The medicine does not work as well if you skip doses. Skipping doses also puts you at risk for problems. Ask your doctor about side effects or  reactions to medicines that you should watch for. Contact a doctor if: You think you are having a reaction to the medicine you are taking. You have headaches that keep coming back. You feel dizzy. You have swelling in your ankles. You have trouble with your vision. Get help right away if: You get a very bad headache. You start to feel mixed up (confused). You feel weak or numb. You feel faint. You have very bad pain in your: Chest. Belly (abdomen). You vomit more than once. You have trouble breathing. These symptoms may be an emergency. Get help right away. Call 911. Do not wait to see if the symptoms will go away. Do not drive yourself to the hospital. Summary Hypertension is another name for high blood pressure. High blood pressure forces your heart to work harder to pump blood. For most people, a normal blood pressure is less than 120/80. Making healthy choices can help lower blood pressure. If your blood pressure does not get lower with healthy choices, you may need to take medicine. This information is not intended to replace advice given to you by your health care provider. Make sure you discuss any questions you have with your health care provider. Document Revised: 04/03/2021 Document Reviewed: 04/03/2021 Elsevier Patient Education  [place].

## 2022-05-07 ENCOUNTER — Other Ambulatory Visit: Payer: Self-pay | Admitting: Internal Medicine

## 2022-05-26 ENCOUNTER — Ambulatory Visit: Payer: BC Managed Care – PPO | Admitting: Internal Medicine

## 2022-05-26 ENCOUNTER — Encounter: Payer: Self-pay | Admitting: Internal Medicine

## 2022-05-26 VITALS — BP 130/76 | HR 86 | Temp 98.7°F | Ht 63.0 in | Wt 156.0 lb

## 2022-05-26 DIAGNOSIS — Z23 Encounter for immunization: Secondary | ICD-10-CM

## 2022-05-26 DIAGNOSIS — R682 Dry mouth, unspecified: Secondary | ICD-10-CM

## 2022-05-26 DIAGNOSIS — F4323 Adjustment disorder with mixed anxiety and depressed mood: Secondary | ICD-10-CM | POA: Diagnosis not present

## 2022-05-26 MED ORDER — ESCITALOPRAM OXALATE 10 MG PO TABS: 10.0000 mg | ORAL_TABLET | Freq: Every day | ORAL | 1 refills | Status: DC

## 2022-05-26 NOTE — Patient Instructions (Signed)

## 2022-05-26 NOTE — Progress Notes (Signed)
Barnet Glasgow Martin,acting as a Education administrator for Maximino Greenland, MD.,have documented all relevant documentation on the behalf of Maximino Greenland, MD,as directed by  Maximino Greenland, MD while in the presence of Maximino Greenland, MD.    Subjective:     Patient ID: Candice Hughes , female    DOB: 12/21/1970 , 51 y.o.   MRN: [id number]   Chief Complaint  Patient presents with   Follow-up    HPI  Patient presents today for a follow up on the Lexapro, Patient states she think the medication is doing good for her, she has noticed an improvement in her mood.  She also reports her son has noted that her mood has improved as well. She would like to continue with the medication.  She has not had any ill effects from the medication. She has noticed that she has had a dry mouth.  She has had to keep water at the bedside at night. She is not sure if this is related to the medication.  She feels fine during the day.    BP Readings from Last 3 Encounters: 05/26/22 : 130/76 04/14/22 : 130/82 03/13/22 : (!) 137/90       Past Medical History:  Diagnosis Date   Acne 03/15/2014   Cancer ([place]) 2005   history of lymphoma-chest tumor   Headache(784.0)    History of blood transfusion 2005   surgery related- [place]   Hypertension      Family History  Problem Relation Age of Onset   Hypertension Mother    Hypertension Father    Hyperlipidemia Father    Diabetes Father    Kidney failure Father      Current Outpatient Medications:    ALPRAZolam (XANAX) 0.25 MG tablet, Take 1 tablet (0.25 mg total) by mouth daily as needed., Disp: 30 tablet, Rfl: 0   Cholecalciferol (VITAMIN D-3 PO), Take 2,000 Units by mouth daily. , Disp: , Rfl:    estradiol (VIVELLE-DOT) 0.0375 MG/24HR, Place 1 patch onto the skin 2 (two) times a week., Disp: 24 patch, Rfl: 1   fexofenadine (ALLEGRA) 180 MG tablet, Take 180 mg by mouth daily., Disp: , Rfl:    fluticasone (FLONASE) 50 MCG/ACT nasal spray, SPRAY 1 SPRAY INTO EACH  NOSTRIL EVERY DAY, Disp: 48 mL, Rfl: 1   ibuprofen (ADVIL,MOTRIN) 600 MG tablet, Take 1 tablet (600 mg total) by mouth every 6 (six) hours as needed (mild pain)., Disp: 30 tablet, Rfl: 0   latanoprost (XALATAN) 0.005 % ophthalmic solution, INSTILL 1 DROP INTO BOTH EYES EVERY DAY AT NIGHT, Disp: , Rfl:    pravastatin (PRAVACHOL) 40 MG tablet, TAKE 1 TABLET BY MOUTH EVERY DAY IN THE EVENING, Disp: 90 tablet, Rfl: 1   spironolactone (ALDACTONE) 50 MG tablet, TAKE 1 TABLET BY MOUTH EVERY DAY IN THE MORNING, Disp: 90 tablet, Rfl: 1   escitalopram (LEXAPRO) 10 MG tablet, Take 1 tablet (10 mg total) by mouth daily., Disp: 90 tablet, Rfl: 1   Allergies  Allergen Reactions   Iodinated Contrast Media Itching   Iohexol      Code: HIVES, Desc: hives, itching, itchy throat, premed w/ benadryl prior to scan, (done w/ '50mg'$  just prior to scan 05/24/08 w/ no probs dr hall approved)      Review of Systems  Constitutional: Negative.   Respiratory: Negative.    Cardiovascular: Negative.   Gastrointestinal: Negative.   Neurological: Negative.   Psychiatric/Behavioral: Negative.       Today's  Vitals   05/26/22 1609  BP: 130/76  Pulse: 86  Temp: 98.7 F (37.1 C)  TempSrc: Oral  Weight: 156 lb (70.8 kg)  Height: '5\' 3"'$  (1.6 m)  PainSc: 0-No pain   Body mass index is 27.63 kg/m.  Wt Readings from Last 3 Encounters:  05/26/22 156 lb (70.8 kg)  04/14/22 152 lb 6.4 oz (69.1 kg)  09/02/21 155 lb (70.3 kg)    Objective:  Physical Exam Vitals and nursing note reviewed.  Constitutional:      Appearance: Normal appearance.  HENT:     Head: Normocephalic and atraumatic.     Nose:     Comments: Masked     Mouth/Throat:     Comments: Masked  Eyes:     Extraocular Movements: Extraocular movements intact.  Cardiovascular:     Rate and Rhythm: Normal rate and regular rhythm.     Heart sounds: Normal heart sounds.  Pulmonary:     Effort: Pulmonary effort is normal.     Breath sounds: Normal  breath sounds.  Skin:    General: Skin is warm.  Neurological:     General: No focal deficit present.     Mental Status: She is alert.  Psychiatric:        Mood and Affect: Mood normal.        Behavior: Behavior normal.         Assessment And Plan:     1. Adjustment disorder with mixed anxiety and depressed mood Comments: Improved with escitalopram. She will c/w current meds. She will f/u March 2024 for her next CPE. Advised to take meds 4-6 months to decrease risk of relapse.  2. Dry mouth Comments: She is encouraged to stay well hydrated and increase intake of healthy fats. Also advised to consider humidifier nightly.  3. Need for influenza vaccination - Flu Vaccine QUAD 6+ mos PF IM (Fluarix Quad PF)     Patient was given opportunity to ask questions. Patient verbalized understanding of the plan and was able to repeat key elements of the plan. All questions were answered to their satisfaction.   I, Maximino Greenland, MD, have reviewed all documentation for this visit. The documentation on 05/26/22 for the exam, diagnosis, procedures, and orders are all accurate and complete.   IF YOU HAVE BEEN REFERRED TO A SPECIALIST, IT MAY TAKE 1-2 WEEKS TO SCHEDULE/PROCESS THE REFERRAL. IF YOU HAVE NOT HEARD FROM US/SPECIALIST IN TWO WEEKS, PLEASE GIVE [place] A CALL AT [phone].   THE PATIENT IS ENCOURAGED TO PRACTICE SOCIAL DISTANCING DUE TO THE COVID-19 PANDEMIC.

## 2022-08-26 ENCOUNTER — Ambulatory Visit: Payer: BC Managed Care – PPO | Admitting: Internal Medicine

## 2022-09-08 ENCOUNTER — Encounter: Payer: Self-pay | Admitting: Internal Medicine

## 2022-09-08 ENCOUNTER — Ambulatory Visit (INDEPENDENT_AMBULATORY_CARE_PROVIDER_SITE_OTHER): Payer: BC Managed Care – PPO | Admitting: Internal Medicine

## 2022-09-08 VITALS — BP 126/90 | HR 101 | Temp 98.2°F | Ht 63.0 in | Wt 159.4 lb

## 2022-09-08 DIAGNOSIS — E663 Overweight: Secondary | ICD-10-CM | POA: Diagnosis not present

## 2022-09-08 DIAGNOSIS — I10 Essential (primary) hypertension: Secondary | ICD-10-CM | POA: Diagnosis not present

## 2022-09-08 DIAGNOSIS — Z0000 Encounter for general adult medical examination without abnormal findings: Secondary | ICD-10-CM

## 2022-09-08 DIAGNOSIS — Z6828 Body mass index (BMI) 28.0-28.9, adult: Secondary | ICD-10-CM

## 2022-09-08 DIAGNOSIS — Z6379 Other stressful life events affecting family and household: Secondary | ICD-10-CM

## 2022-09-08 DIAGNOSIS — R0981 Nasal congestion: Secondary | ICD-10-CM | POA: Diagnosis not present

## 2022-09-08 LAB — POCT URINALYSIS DIPSTICK
Bilirubin, UA: NEGATIVE
Glucose, UA: NEGATIVE
Ketones, UA: NEGATIVE
Leukocytes, UA: NEGATIVE
Nitrite, UA: NEGATIVE
Protein, UA: POSITIVE — AB
Spec Grav, UA: >=1.03 — AB (ref 1.010–1.025)
Urobilinogen, UA: 0.2 E.U./dL
pH, UA: 6 (ref 5.0–8.0)

## 2022-09-08 MED ORDER — CITALOPRAM HYDROBROMIDE 10 MG PO TABS: 10.0000 mg | ORAL_TABLET | Freq: Every day | ORAL | 2 refills | Status: DC

## 2022-09-08 NOTE — Patient Instructions (Signed)

## 2022-09-08 NOTE — Progress Notes (Signed)
I,Victoria T Hamilton,acting as a scribe for Maximino Greenland, MD.,have documented all relevant documentation on the behalf of Maximino Greenland, MD,as directed by  Maximino Greenland, MD while in the presence of Maximino Greenland, MD.   Subjective:     Patient ID: Candice Hughes , female    DOB: [birth year] , 52 y.o.   MRN: [id number]   Chief Complaint  Patient presents with   Annual Exam   Hypertension    HPI  She is here today for a full physical examination. She is followed by Dr. Louretta Shorten for her GYN exams. She reports getting hysterectomy 10 years ago. S/he reports compliance with meds. She denies headaches, chest pain and shortness of breath.   She is currently experiencing cold symptoms. She c/o congestion, runny nose, sneezing & headache. She has taken the Allegra daily. She does not think medication is helpful. No fever or chills.       Hypertension This is a chronic problem. The current episode started more than 1 year ago. The problem has been gradually improving since onset. The problem is controlled. Pertinent negatives include no blurred vision or palpitations. Past treatments include diuretics. The current treatment provides moderate improvement. There is no history of kidney disease, CAD/MI or retinopathy.     Past Medical History:  Diagnosis Date   Acne 03/15/2014   Cancer [facility]) 2005   history of lymphoma-chest tumor   Headache(784.0)    History of blood transfusion 2005   surgery related- [place]   Hypertension      Family History  Problem Relation Age of Onset   Hypertension Mother    Hypertension Father    Hyperlipidemia Father    Diabetes Father    Kidney failure Father      Current Outpatient Medications:    ALPRAZolam (XANAX) 0.25 MG tablet, Take 1 tablet (0.25 mg total) by mouth daily as needed., Disp: 30 tablet, Rfl: 0   Cholecalciferol (VITAMIN D-3 PO), Take 2,000 Units by mouth daily. , Disp: , Rfl:    citalopram (CELEXA) 10 MG tablet, Take 1  tablet (10 mg total) by mouth daily., Disp: 30 tablet, Rfl: 2   estradiol (VIVELLE-DOT) 0.0375 MG/24HR, Place 1 patch onto the skin 2 (two) times a week., Disp: 24 patch, Rfl: 1   fexofenadine (ALLEGRA) 180 MG tablet, Take 180 mg by mouth daily., Disp: , Rfl:    fluticasone (FLONASE) 50 MCG/ACT nasal spray, SPRAY 1 SPRAY INTO EACH NOSTRIL EVERY DAY, Disp: 48 mL, Rfl: 1   ibuprofen (ADVIL,MOTRIN) 600 MG tablet, Take 1 tablet (600 mg total) by mouth every 6 (six) hours as needed (mild pain)., Disp: 30 tablet, Rfl: 0   latanoprost (XALATAN) 0.005 % ophthalmic solution, INSTILL 1 DROP INTO BOTH EYES EVERY DAY AT NIGHT, Disp: , Rfl:    pravastatin (PRAVACHOL) 40 MG tablet, TAKE 1 TABLET BY MOUTH EVERY DAY IN THE EVENING, Disp: 90 tablet, Rfl: 1   spironolactone (ALDACTONE) 50 MG tablet, TAKE 1 TABLET BY MOUTH EVERY DAY IN THE MORNING, Disp: 90 tablet, Rfl: 1   Allergies  Allergen Reactions   Iodinated Contrast Media Itching   Iohexol      Code: HIVES, Desc: hives, itching, itchy throat, premed w/ benadryl prior to scan, (done w/ 50mg  just prior to scan 05/24/08 w/ no probs dr hall approved)       The patient states she uses status post hysterectomy for birth control. Last LMP was Patient's last menstrual period was  11/24/2011.. Negative for Dysmenorrhea. Negative for: breast discharge, breast lump(s), breast pain and breast self exam. Associated symptoms include abnormal vaginal bleeding. Pertinent negatives include abnormal bleeding (hematology), anxiety, decreased libido, depression, difficulty falling sleep, dyspareunia, history of infertility, nocturia, sexual dysfunction, sleep disturbances, urinary incontinence, urinary urgency, vaginal discharge and vaginal itching. Diet regular.    . The patient's tobacco use is:  Social History   Tobacco Use  Smoking Status Never  Smokeless Tobacco Never  . She has been exposed to passive smoke. The patient's alcohol use is:  Social History    Substance and Sexual Activity  Alcohol Use Yes   Comment: socially    Review of Systems  Constitutional: Negative.   HENT:  Positive for congestion and sinus pressure.        Also with increased sneezing  Eyes: Negative.  Negative for blurred vision.  Respiratory: Negative.    Cardiovascular: Negative.  Negative for palpitations.  Gastrointestinal: Negative.   Endocrine: Negative.   Genitourinary: Negative.   Musculoskeletal: Negative.   Skin: Negative.   Allergic/Immunologic: Negative.   Neurological: Negative.   Hematological: Negative.   Psychiatric/Behavioral: Negative.       Today's Vitals   09/08/22 1103  BP: (!) 126/90  Pulse: (!) 101  Temp: 98.2 F (36.8 C)  SpO2: 98%  Weight: 159 lb 6.4 oz (72.3 kg)  Height: 5\' 3"  (1.6 m)   Body mass index is 28.24 kg/m.  Wt Readings from Last 3 Encounters:  09/08/22 159 lb 6.4 oz (72.3 kg)  05/26/22 156 lb (70.8 kg)  04/14/22 152 lb 6.4 oz (69.1 kg)    Objective:  Physical Exam Vitals and nursing note reviewed.  Constitutional:      Appearance: Normal appearance.  HENT:     Head: Normocephalic and atraumatic.     Right Ear: Tympanic membrane, ear canal and external ear normal.     Left Ear: Tympanic membrane, ear canal and external ear normal.     Nose:     Comments: Masked     Mouth/Throat:     Comments: Masked  Eyes:     Extraocular Movements: Extraocular movements intact.     Conjunctiva/sclera: Conjunctivae normal.     Pupils: Pupils are equal, round, and reactive to light.  Cardiovascular:     Rate and Rhythm: Normal rate and regular rhythm.     Pulses: Normal pulses.          Dorsalis pedis pulses are 2+ on the right side and 2+ on the left side.     Heart sounds: Normal heart sounds.  Pulmonary:     Effort: Pulmonary effort is normal.     Breath sounds: Normal breath sounds.  Chest:  Breasts:    Tanner Score is 5.     Right: Normal.     Left: Normal.  Abdominal:     General: Abdomen is flat.  Bowel sounds are normal.     Palpations: Abdomen is soft.  Genitourinary:    Comments: deferred Musculoskeletal:        General: Normal range of motion.     Cervical back: Normal range of motion and neck supple.  Skin:    General: Skin is warm and dry.  Neurological:     General: No focal deficit present.     Mental Status: She is alert and oriented to person, place, and time.  Psychiatric:        Mood and Affect: Mood normal.  Behavior: Behavior normal.         Assessment And Plan:     1. Routine general medical examination at health care facility Comments: A full exam was performed. Importance of monthly self breast exams was discussed with the patient.  PATIENT IS ADVISED TO GET 30-45 MINUTES REGULAR EXERCISE NO LESS THAN FOUR TO FIVE DAYS PER WEEK - BOTH WEIGHTBEARING EXERCISES AND AEROBIC ARE RECOMMENDED.  PATIENT IS ADVISED TO FOLLOW A HEALTHY DIET WITH AT LEAST SIX FRUITS/VEGGIES PER DAY, DECREASE INTAKE OF RED MEAT, AND TO INCREASE FISH INTAKE TO TWO DAYS PER WEEK.  MEATS/FISH SHOULD NOT BE FRIED, BAKED OR BROILED IS PREFERABLE.  IT IS ALSO IMPORTANT TO CUT BACK ON YOUR SUGAR INTAKE. PLEASE AVOID ANYTHING WITH ADDED SUGAR, CORN SYRUP OR OTHER SWEETENERS. IF YOU MUST USE A SWEETENER, YOU CAN TRY STEVIA. IT IS ALSO IMPORTANT TO AVOID ARTIFICIALLY SWEETENERS AND DIET BEVERAGES. LASTLY, I SUGGEST WEARING SPF 50 SUNSCREEN ON EXPOSED PARTS AND ESPECIALLY WHEN IN THE DIRECT SUNLIGHT FOR AN EXTENDED PERIOD OF TIME.  PLEASE AVOID FAST FOOD RESTAURANTS AND INCREASE YOUR WATER INTAKE. - CMP14+EGFR - CBC - Lipid panel - Hemoglobin A1c  2. Essential hypertension, benign Comments: Chronic, fair control. She admits she is under a great deal of stress. EKG performed, NSR w/ nonspecific T abnormality. She will c/w spironolactone for now. May need to add amlodipine vs. ARB in the near future.  - POCT Urinalysis Dipstick (81002) - Microalbumin / creatinine urine ratio - EKG 12-Lead  3.  Nasal congestion Comments: Rapid COVID test is negative. Advised to limit dairy, try Zyrtec nighlty. She will let me know if her sx persist. - POC COVID-19 BinaxNow  4. Overweight with body mass index (BMI) of 28 to 28.9 in adult Comments: She is encoureaged to aim for at least 150 minutes of exercise.  5. Stressful life event affecting family Comments: She wishes to resume pharmacologic therapy. I will start Celexa 10mg  daily.  Possible side effects include nausea, headaches, and dizziness. Agrees to f/u in 6 wks. She plans to continue with psychotherapy. - citalopram (CELEXA) 10 MG tablet; Take 1 tablet (10 mg total) by mouth daily.  Dispense: 30 tablet; Refill: 2   Patient was given opportunity to ask questions. Patient verbalized understanding of the plan and was able to repeat key elements of the plan. All questions were answered to their satisfaction.   I, Maximino Greenland, MD, have reviewed all documentation for this visit. The documentation on 09/08/22 for the exam, diagnosis, procedures,   THE PATIENT IS ENCOURAGED TO PRACTICE SOCIAL DISTANCING DUE TO THE COVID-19 PANDEMIC.

## 2022-09-09 LAB — CMP14+EGFR
ALT: 25 IU/L (ref 0–32)
AST: 21 IU/L (ref 0–40)
Albumin/Globulin Ratio: 1.8 (ref 1.2–2.2)
Albumin: 4.5 g/dL (ref 3.8–4.9)
Alkaline Phosphatase: 71 IU/L (ref 44–121)
BUN/Creatinine Ratio: 17 (ref 9–23)
BUN: 14 mg/dL (ref 6–24)
Bilirubin Total: 0.3 mg/dL (ref 0.0–1.2)
CO2: 26 mmol/L (ref 20–29)
Calcium: 9.9 mg/dL (ref 8.7–10.2)
Chloride: 102 mmol/L (ref 96–106)
Creatinine, Ser: 0.83 mg/dL (ref 0.57–1.00)
Globulin, Total: 2.5 g/dL (ref 1.5–4.5)
Glucose: 77 mg/dL (ref 70–99)
Potassium: 4.6 mmol/L (ref 3.5–5.2)
Sodium: 141 mmol/L (ref 134–144)
Total Protein: 7 g/dL (ref 6.0–8.5)
eGFR: 84 mL/min/{1.73_m2} (ref >59)

## 2022-09-09 LAB — CBC
Hematocrit: 41.1 % (ref 34.0–46.6)
Hemoglobin: 13.5 g/dL (ref 11.1–15.9)
MCH: 29.1 pg (ref 26.6–33.0)
MCHC: 32.8 g/dL (ref 31.5–35.7)
MCV: 89 fL (ref 79–97)
Platelets: 272 10*3/uL (ref 150–450)
RBC: 4.64 x10E6/uL (ref 3.77–5.28)
RDW: 12.3 % (ref 11.7–15.4)
WBC: 4.6 10*3/uL (ref 3.4–10.8)

## 2022-09-09 LAB — LIPID PANEL
Chol/HDL Ratio: 3 ratio (ref 0.0–4.4)
Cholesterol, Total: 166 mg/dL (ref 100–199)
HDL: 56 mg/dL (ref >39)
LDL Chol Calc (NIH): 88 mg/dL (ref 0–99)
Triglycerides: 127 mg/dL (ref 0–149)
VLDL Cholesterol Cal: 22 mg/dL (ref 5–40)

## 2022-09-09 LAB — MICROALBUMIN / CREATININE URINE RATIO
Creatinine, Urine: 399.3 mg/dL
Microalb/Creat Ratio: 16 mg/g creat (ref 0–29)
Microalbumin, Urine: 63.5 ug/mL

## 2022-09-09 LAB — HEMOGLOBIN A1C
Est. average glucose Bld gHb Est-mCnc: 123 mg/dL
Hgb A1c MFr Bld: 5.9 % — ABNORMAL HIGH (ref 4.8–5.6)

## 2022-09-10 NOTE — Progress Notes (Incomplete)
I,Victoria T Hamilton,acting as a scribe for Maximino Greenland, MD.,have documented all relevant documentation on the behalf of Maximino Greenland, MD,as directed by  Maximino Greenland, MD while in the presence of Maximino Greenland, MD.   Subjective:     Patient ID: Candice Hughes , female    DOB: April 08, 1971 , 52 y.o.   MRN: [id number]   Chief Complaint  Patient presents with  . Annual Exam  . Hypertension  . Hyperlipidemia    HPI  She is here today for a full physical examination. She is followed by Dr. Louretta Shorten for her GYN exams. She reports getting hysterectomy 10 years ago. S/he reports compliance with meds. She denies headaches, chest pain and shortness of breath.   She adds, experiencing cold like symptoms. Congestion, runny nose, sneezing & headache. She has taken the Allegra daily. She does not think med seems to help. No fever or chills.       Hypertension This is a chronic problem. The current episode started more than 1 year ago. The problem has been gradually improving since onset. The problem is controlled. Pertinent negatives include no blurred vision, chest pain, palpitations or shortness of breath. Past treatments include diuretics. The current treatment provides moderate improvement.  Hyperlipidemia Pertinent negatives include no chest pain or shortness of breath.     Past Medical History:  Diagnosis Date  . Acne 03/15/2014  . Cancer [facility]) 2005   history of lymphoma-chest tumor  . Headache(784.0)   . History of blood transfusion 2005   surgery related- [place]  . Hypertension      Family History  Problem Relation Age of Onset  . Hypertension Mother   . Hypertension Father   . Hyperlipidemia Father   . Diabetes Father   . Kidney failure Father      Current Outpatient Medications:  .  ALPRAZolam (XANAX) 0.25 MG tablet, Take 1 tablet (0.25 mg total) by mouth daily as needed., Disp: 30 tablet, Rfl: 0 .  Cholecalciferol (VITAMIN D-3 PO), Take 2,000 Units  by mouth daily. , Disp: , Rfl:  .  citalopram (CELEXA) 10 MG tablet, Take 1 tablet (10 mg total) by mouth daily., Disp: 30 tablet, Rfl: 2 .  estradiol (VIVELLE-DOT) 0.0375 MG/24HR, Place 1 patch onto the skin 2 (two) times a week., Disp: 24 patch, Rfl: 1 .  fexofenadine (ALLEGRA) 180 MG tablet, Take 180 mg by mouth daily., Disp: , Rfl:  .  fluticasone (FLONASE) 50 MCG/ACT nasal spray, SPRAY 1 SPRAY INTO EACH NOSTRIL EVERY DAY, Disp: 48 mL, Rfl: 1 .  ibuprofen (ADVIL,MOTRIN) 600 MG tablet, Take 1 tablet (600 mg total) by mouth every 6 (six) hours as needed (mild pain)., Disp: 30 tablet, Rfl: 0 .  latanoprost (XALATAN) 0.005 % ophthalmic solution, INSTILL 1 DROP INTO BOTH EYES EVERY DAY AT NIGHT, Disp: , Rfl:  .  pravastatin (PRAVACHOL) 40 MG tablet, TAKE 1 TABLET BY MOUTH EVERY DAY IN THE EVENING, Disp: 90 tablet, Rfl: 1 .  spironolactone (ALDACTONE) 50 MG tablet, TAKE 1 TABLET BY MOUTH EVERY DAY IN THE MORNING, Disp: 90 tablet, Rfl: 1   Allergies  Allergen Reactions  . Iodinated Contrast Media Itching  . Iohexol      Code: HIVES, Desc: hives, itching, itchy throat, premed w/ benadryl prior to scan, (done w/ '50mg'$  just prior to scan 05/24/08 w/ no probs dr hall approved)       The patient states she uses {contraceptive methods:5051} for birth control.  Last LMP was Patient's last menstrual period was 11/24/2011.. {Dysmenorrhea-menorrhagia:21918}. Negative for: breast discharge, breast lump(s), breast pain and breast self exam. Associated symptoms include abnormal vaginal bleeding. Pertinent negatives include abnormal bleeding (hematology), anxiety, decreased libido, depression, difficulty falling sleep, dyspareunia, history of infertility, nocturia, sexual dysfunction, sleep disturbances, urinary incontinence, urinary urgency, vaginal discharge and vaginal itching. Diet regular.The patient states her exercise level is    . The patient's tobacco use is:  Social History   Tobacco Use  Smoking  Status Never  Smokeless Tobacco Never  . She has been exposed to passive smoke. The patient's alcohol use is:  Social History   Substance and Sexual Activity  Alcohol Use Yes   Comment: socially  . Additional information: Last pap ***, next one scheduled for ***.    Review of Systems  Constitutional: Negative.   HENT:  Positive for congestion and sinus pressure.        Also with increased sneezing  Eyes: Negative.  Negative for blurred vision.  Respiratory: Negative.  Negative for shortness of breath.   Cardiovascular: Negative.  Negative for chest pain and palpitations.  Gastrointestinal: Negative.   Endocrine: Negative.   Genitourinary: Negative.   Musculoskeletal: Negative.   Skin: Negative.   Allergic/Immunologic: Negative.   Neurological: Negative.   Hematological: Negative.   Psychiatric/Behavioral: Negative.       Today's Vitals   09/08/22 1103  BP: (!) 126/90  Pulse: (!) 101  Temp: 98.2 F (36.8 C)  SpO2: 98%  Weight: 159 lb 6.4 oz (72.3 kg)  Height: '5\' 3"'$  (1.6 m)   Body mass index is 28.24 kg/m.  Wt Readings from Last 3 Encounters:  09/08/22 159 lb 6.4 oz (72.3 kg)  05/26/22 156 lb (70.8 kg)  04/14/22 152 lb 6.4 oz (69.1 kg)    Objective:  Physical Exam Vitals and nursing note reviewed.  Constitutional:      Appearance: Normal appearance.  HENT:     Head: Normocephalic and atraumatic.     Right Ear: Tympanic membrane, ear canal and external ear normal.     Left Ear: Tympanic membrane, ear canal and external ear normal.     Nose:     Comments: Masked     Mouth/Throat:     Comments: Masked  Eyes:     Extraocular Movements: Extraocular movements intact.     Conjunctiva/sclera: Conjunctivae normal.     Pupils: Pupils are equal, round, and reactive to light.  Cardiovascular:     Rate and Rhythm: Normal rate and regular rhythm.     Pulses: Normal pulses.     Heart sounds: Normal heart sounds.  Pulmonary:     Effort: Pulmonary effort is normal.      Breath sounds: Normal breath sounds.  Abdominal:     General: Abdomen is flat. Bowel sounds are normal.     Palpations: Abdomen is soft.  Genitourinary:    Comments: deferred Musculoskeletal:        General: Normal range of motion.     Cervical back: Normal range of motion and neck supple.  Skin:    General: Skin is warm and dry.  Neurological:     General: No focal deficit present.     Mental Status: She is alert and oriented to person, place, and time.  Psychiatric:        Mood and Affect: Mood normal.        Behavior: Behavior normal.         Assessment And Plan:  1. Routine general medical examination at health care facility Comments: A full exam was performed. Importance of monthly self breast exams was discussed with the patient.  PATIENT IS ADVISED TO GET 30-45 MINUTES REGULAR EXERCISE NO LESS THAN FOUR TO FIVE DAYS PER WEEK - BOTH WEIGHTBEARING EXERCISES AND AEROBIC ARE RECOMMENDED.  PATIENT IS ADVISED TO FOLLOW A HEALTHY DIET WITH AT LEAST SIX FRUITS/VEGGIES PER DAY, DECREASE INTAKE OF RED MEAT, AND TO INCREASE FISH INTAKE TO TWO DAYS PER WEEK.  MEATS/FISH SHOULD NOT BE FRIED, BAKED OR BROILED IS PREFERABLE.  IT IS ALSO IMPORTANT TO CUT BACK ON YOUR SUGAR INTAKE. PLEASE AVOID ANYTHING WITH ADDED SUGAR, CORN SYRUP OR OTHER SWEETENERS. IF YOU MUST USE A SWEETENER, YOU CAN TRY STEVIA. IT IS ALSO IMPORTANT TO AVOID ARTIFICIALLY SWEETENERS AND DIET BEVERAGES. LASTLY, I SUGGEST WEARING SPF 50 SUNSCREEN ON EXPOSED PARTS AND ESPECIALLY WHEN IN THE DIRECT SUNLIGHT FOR AN EXTENDED PERIOD OF TIME.  PLEASE AVOID FAST FOOD RESTAURANTS AND INCREASE YOUR WATER INTAKE. - CMP14+EGFR - CBC - Lipid panel - POC COVID-19 BinaxNow - Hemoglobin A1c  2. Essential hypertension, benign Comments: Chronic, fair control. She admits she is under a great deal of stress. EKG performed, NSR w/ nonspecific T abnormality. She will c/w spironolactone for now. May need to add amlodipine vs. ARB in the near  future.  - POCT Urinalysis Dipstick (81002) - Microalbumin / creatinine urine ratio - EKG 12-Lead  3. Nasal congestion Comments: Rapid COVID test is negative. Advised to limit dairy, try Zyrtec nighlty. She will let me know if her sx persist. - POC COVID-19 BinaxNow  4. Overweight with body mass index (BMI) of 28 to 28.9 in adult Comments: She is encoureaged to aim for at least 150 minutes of exercise.  5. Stressful life event affecting family Comments: She wishes to resume pharmacologic therapy. I will start Celexa '10mg'$  daily.  Possible side effects include nausea, headaches, and dizziness. Agrees to f/u in 6 wks. She plans to continue with psychotherapy. - citalopram (CELEXA) 10 MG tablet; Take 1 tablet (10 mg total) by mouth daily.  Dispense: 30 tablet; Refill: 2   Patient was given opportunity to ask questions. Patient verbalized understanding of the plan and was able to repeat key elements of the plan. All questions were answered to their satisfaction.   I, Maximino Greenland, MD, have reviewed all documentation for this visit. The documentation on 09/08/22 for the exam, diagnosis, procedures,   THE PATIENT IS ENCOURAGED TO PRACTICE SOCIAL DISTANCING DUE TO THE COVID-19 PANDEMIC.

## 2022-09-11 ENCOUNTER — Encounter: Payer: Self-pay | Admitting: Internal Medicine

## 2022-09-11 LAB — POC COVID19 BINAXNOW: SARS Coronavirus 2 Ag: NEGATIVE

## 2022-09-18 ENCOUNTER — Other Ambulatory Visit: Payer: Self-pay | Admitting: Internal Medicine

## 2022-09-18 DIAGNOSIS — I10 Essential (primary) hypertension: Secondary | ICD-10-CM

## 2022-09-23 ENCOUNTER — Other Ambulatory Visit: Payer: Self-pay | Admitting: Internal Medicine

## 2022-09-23 DIAGNOSIS — Z1231 Encounter for screening mammogram for malignant neoplasm of breast: Secondary | ICD-10-CM

## 2022-10-04 ENCOUNTER — Other Ambulatory Visit: Payer: Self-pay | Admitting: Internal Medicine

## 2022-10-04 DIAGNOSIS — Z6379 Other stressful life events affecting family and household: Secondary | ICD-10-CM

## 2022-10-26 ENCOUNTER — Ambulatory Visit
Admission: RE | Admit: 2022-10-26 | Discharge: 2022-10-26 | Disposition: A | Discharge status: Home / Self Care | Payer: BC Managed Care – PPO | Source: Ambulatory Visit | Attending: Internal Medicine | Admitting: Internal Medicine

## 2022-10-26 DIAGNOSIS — Z1231 Encounter for screening mammogram for malignant neoplasm of breast: Secondary | ICD-10-CM

## 2022-11-05 ENCOUNTER — Encounter: Payer: Self-pay | Admitting: Internal Medicine

## 2022-11-05 ENCOUNTER — Telehealth (INDEPENDENT_AMBULATORY_CARE_PROVIDER_SITE_OTHER): Payer: BC Managed Care – PPO | Admitting: Internal Medicine

## 2022-11-05 DIAGNOSIS — M25562 Pain in left knee: Secondary | ICD-10-CM

## 2022-11-05 DIAGNOSIS — R809 Proteinuria, unspecified: Secondary | ICD-10-CM | POA: Diagnosis not present

## 2022-11-05 DIAGNOSIS — M25561 Pain in right knee: Secondary | ICD-10-CM | POA: Diagnosis not present

## 2022-11-05 DIAGNOSIS — I10 Essential (primary) hypertension: Secondary | ICD-10-CM

## 2022-11-05 DIAGNOSIS — F4323 Adjustment disorder with mixed anxiety and depressed mood: Secondary | ICD-10-CM | POA: Diagnosis not present

## 2022-11-05 DIAGNOSIS — G8929 Other chronic pain: Secondary | ICD-10-CM

## 2022-11-05 NOTE — Progress Notes (Signed)
Virtual Visit via Video   This visit type was conducted due to national recommendations for restrictions regarding the COVID-19 Pandemic (e.g. social distancing) in an effort to limit this patient's exposure and mitigate transmission in our community.  Due to her co-morbid illnesses, this patient is at least at moderate risk for complications without adequate follow up.  This format is felt to be most appropriate for this patient at this time.  All issues noted in this document were discussed and addressed.  A limited physical exam was performed with this format.    This visit type was conducted due to national recommendations for restrictions regarding the COVID-19 Pandemic (e.g. social distancing) in an effort to limit this patient's exposure and mitigate transmission in our community.  Patients identity confirmed using two different identifiers.  This format is felt to be most appropriate for this patient at this time.  All issues noted in this document were discussed and addressed.  No physical exam was performed (except for noted visual exam findings with Video Visits).    Date:  11/15/2022   ID:  [name], DOB [birth year], MRN [id number]  Patient Location:  Work  Provider location:   Biomedical engineer Complaint:  "I have med check"  History of Present Illness:    Candice Hughes is a 53 y.o. female who presents via video conferencing for a telehealth visit today.    The patient does not have symptoms concerning for COVID-19 infection (fever, chills, cough, or new shortness of breath).   She presents today for med check. She has requested virtual visit.  She was prescribed Celexa at her last visit. She has not had any issues with the medication. She has noticed that she doesn't react to stressful situations the same. She reports compliance with medication. She has no specific questions or concerns.        Past Medical History:  Diagnosis Date   Acne 03/15/2014   Cancer  [facility]) 2005   history of lymphoma-chest tumor   Headache(784.0)    History of blood transfusion 2005   surgery related- [place]   Hypertension    Past Surgical History:  Procedure Laterality Date   ABDOMINAL HYSTERECTOMY  12/21/2011   Procedure: HYSTERECTOMY ABDOMINAL;  Surgeon: Turner Daniels, MD;  Location: [facility];  Service: Gynecology;  Laterality: N/A;  left Oophorectomy   CHEST WALL BIOPSY  2005   DILATION AND CURETTAGE OF UTERUS  2005   x2   MYOMECTOMY  2000     No outpatient medications have been marked as taking for the 11/05/22 encounter (Video Visit) with Dorothyann Peng, MD.     Allergies:   Iodinated contrast media and Iohexol   Social History   Tobacco Use   Smoking status: Never   Smokeless tobacco: Never  Vaping Use   Vaping Use: Never used  Substance Use Topics   Alcohol use: Yes    Comment: socially   Drug use: No     Family Hx: The patient's family history includes Diabetes in her father; Hyperlipidemia in her father; Hypertension in her father and mother; Kidney failure in her father. There is no history of Breast cancer.  ROS:   Please see the history of present illness.    Review of Systems  Constitutional: Negative.   HENT: Negative.    Respiratory: Negative.    Cardiovascular: Negative.   Gastrointestinal: Negative.   Genitourinary: Negative.   Musculoskeletal:  Positive for neck pain.  Endo/Heme/Allergies: Negative.   Psychiatric/Behavioral: Negative.      All other systems reviewed and are negative.   Labs/Other Tests and Data Reviewed:    Recent Labs: 09/08/2022: ALT 25; BUN 14; Creatinine, Ser 0.83; Hemoglobin 13.5; Platelets 272; Potassium 4.6; Sodium 141   Recent Lipid Panel Lab Results  Component Value Date/Time   CHOL 166 09/08/2022 12:16 PM   TRIG 127 09/08/2022 12:16 PM   HDL 56 09/08/2022 12:16 PM   CHOLHDL 3.0 09/08/2022 12:16 PM   LDLCALC 88 09/08/2022 12:16 PM    Wt Readings from Last 3 Encounters:  09/08/22 159 lb  6.4 oz (72.3 kg)  05/26/22 156 lb (70.8 kg)  04/14/22 152 lb 6.4 oz (69.1 kg)     Exam:    Vital Signs:  LMP 11/24/2011     Physical Exam Vitals and nursing note reviewed.  Constitutional:      Appearance: Normal appearance.  HENT:     Head: Normocephalic and atraumatic.  Eyes:     Extraocular Movements: Extraocular movements intact.  Pulmonary:     Effort: Pulmonary effort is normal.  Musculoskeletal:     Cervical back: Normal range of motion.  Neurological:     Mental Status: She is alert and oriented to person, place, and time.  Psychiatric:        Mood and Affect: Affect normal.     ASSESSMENT & PLAN:    1. Adjustment disorder with mixed anxiety and depressed mood Comments: Improved w/ use of citalopram. She will c/w current meds. She will f/u in 3-4 months for re-evaluation.  2. Essential hypertension, benign Comments: Chronic, no med changes. Reminded to follow low sodium diet.  3. Microalbuminuria Comments: Noted in March 2024; however, trace RBC noted in urinalysis. She agrees to repeat testing, possibly related to HTN. - Urinalysis, Reflex Microscopic; Future - Microalbumin / Creatinine Urine Ratio; Future  4. Chronic pain of both knees Comments: Advised to incorporate ginger/turmeric tea into her diet. She will c/w vit D supplementation as well. She will let me know if persistent.    COVID-19 Education: The signs and symptoms of COVID-19 were discussed with the patient and how to seek care for testing (follow up with PCP or arrange E-visit).  The importance of social distancing was discussed today.  Patient Risk:   After full review of this patients clinical status, I feel that they are at least moderate risk at this time.  Time:   Today, I have spent 15 minutes/ seconds with the patient with telehealth technology discussing above diagnoses.     Medication Adjustments/Labs and Tests Ordered: Current medicines are reviewed at length with the patient  today.  Concerns regarding medicines are outlined above.   Tests Ordered: Orders Placed This Encounter  Procedures   Urinalysis, Reflex Microscopic   Microalbumin / Creatinine Urine Ratio    Medication Changes: No orders of the defined types were placed in this encounter.   Disposition:  Follow up in 2 month(s)  Signed, Gwynneth Aliment, MD

## 2022-11-05 NOTE — Patient Instructions (Signed)

## 2022-11-27 ENCOUNTER — Other Ambulatory Visit: Payer: Self-pay | Admitting: Internal Medicine

## 2022-11-27 DIAGNOSIS — E7800 Pure hypercholesterolemia, unspecified: Secondary | ICD-10-CM

## 2023-02-16 ENCOUNTER — Other Ambulatory Visit: Payer: Self-pay

## 2023-02-16 MED ORDER — ESTRADIOL 0.0375 MG/24HR TD PTTW: 1.0000 | MEDICATED_PATCH | TRANSDERMAL | 1 refills | Status: DC

## 2023-02-19 ENCOUNTER — Telehealth: Payer: Self-pay | Admitting: Internal Medicine

## 2023-02-19 NOTE — Telephone Encounter (Signed)
Patient was called to change her appointment for 9/16 to 9/18 due to Dr. Allyne Gee being out of the office. No answer, left VM.

## 2023-03-02 ENCOUNTER — Encounter: Payer: Self-pay | Admitting: Internal Medicine

## 2023-03-02 ENCOUNTER — Ambulatory Visit: Payer: BC Managed Care – PPO | Admitting: Internal Medicine

## 2023-03-02 VITALS — BP 114/70 | HR 91 | Temp 98.0°F | Ht 63.0 in | Wt 162.4 lb

## 2023-03-02 DIAGNOSIS — E7800 Pure hypercholesterolemia, unspecified: Secondary | ICD-10-CM

## 2023-03-02 DIAGNOSIS — Z6828 Body mass index (BMI) 28.0-28.9, adult: Secondary | ICD-10-CM

## 2023-03-02 DIAGNOSIS — N951 Menopausal and female climacteric states: Secondary | ICD-10-CM

## 2023-03-02 DIAGNOSIS — I10 Essential (primary) hypertension: Secondary | ICD-10-CM

## 2023-03-02 DIAGNOSIS — F419 Anxiety disorder, unspecified: Secondary | ICD-10-CM

## 2023-03-02 DIAGNOSIS — L309 Dermatitis, unspecified: Secondary | ICD-10-CM | POA: Diagnosis not present

## 2023-03-02 DIAGNOSIS — E663 Overweight: Secondary | ICD-10-CM

## 2023-03-02 DIAGNOSIS — R7309 Other abnormal glucose: Secondary | ICD-10-CM

## 2023-03-02 LAB — POCT URINALYSIS DIPSTICK
Bilirubin, UA: NEGATIVE
Blood, UA: NEGATIVE
Glucose, UA: NEGATIVE
Ketones, UA: NEGATIVE
Leukocytes, UA: NEGATIVE
Nitrite, UA: NEGATIVE
Protein, UA: NEGATIVE
Spec Grav, UA: <=1.005 — AB (ref 1.010–1.025)
Urobilinogen, UA: 0.2 U/dL
pH, UA: 5.5 (ref 5.0–8.0)

## 2023-03-02 MED ORDER — ESTRADIOL 0.025 MG/24HR TD PTTW
1.0000 | MEDICATED_PATCH | TRANSDERMAL | 12 refills | Status: DC
Start: 2023-03-04 — End: 2023-09-20

## 2023-03-02 MED ORDER — PRAVASTATIN SODIUM 40 MG PO TABS
ORAL_TABLET | ORAL | 1 refills | Status: DC
Start: 2023-03-02 — End: 2023-09-13

## 2023-03-02 MED ORDER — CITALOPRAM HYDROBROMIDE 10 MG PO TABS
10.0000 mg | ORAL_TABLET | Freq: Every day | ORAL | 1 refills | Status: DC
Start: 2023-03-02 — End: 2023-09-08

## 2023-03-02 MED ORDER — SPIRONOLACTONE 25 MG PO TABS: 25.0000 mg | ORAL_TABLET | Freq: Every day | ORAL | 1 refills | Status: DC

## 2023-03-02 MED ORDER — TRIAMCINOLONE ACETONIDE 0.1 % EX CREA: TOPICAL_CREAM | CUTANEOUS | 0 refills | Status: AC

## 2023-03-02 NOTE — Progress Notes (Signed)
I,Victoria T Deloria Lair, CMA,acting as a Neurosurgeon for Gwynneth Aliment, MD.,have documented all relevant documentation on the behalf of Gwynneth Aliment, MD,as directed by  Gwynneth Aliment, MD while in the presence of Gwynneth Aliment, MD.  Subjective:  Patient ID: Candice Hughes , female    DOB: 07/20/1970 , 52 y.o.   MRN: [id number]  Chief Complaint  Patient presents with   Hypertension   Hyperlipidemia    HPI  Patient presents today for 6 month f/u on her bp. She reports compliance with meds. She denies headaches, chest pain and shortness of breath.   She also reports experiencing dryness behind ears that has experienced for a while. At home she has used neosporin & Vaseline.     Hypertension This is a chronic problem. The current episode started more than 1 year ago. The problem has been gradually improving since onset. The problem is controlled. Pertinent negatives include no blurred vision, chest pain, palpitations or shortness of breath. The current treatment provides moderate improvement.     Past Medical History:  Diagnosis Date   Acne 03/15/2014   Cancer [facility]) 2005   history of lymphoma-chest tumor   Headache(784.0)    History of blood transfusion 2005   surgery related- [place]   Hypertension      Family History  Problem Relation Age of Onset   Hypertension Mother    Hypertension Father    Hyperlipidemia Father    Diabetes Father    Kidney failure Father    Breast cancer Neg Hx      Current Outpatient Medications:    ALPRAZolam (XANAX) 0.25 MG tablet, Take 1 tablet (0.25 mg total) by mouth daily as needed., Disp: 30 tablet, Rfl: 0   Cholecalciferol (VITAMIN D-3 PO), Take 2,000 Units by mouth daily. , Disp: , Rfl:    estradiol (VIVELLE-DOT) 0.025 MG/24HR, Place 1 patch onto the skin 2 (two) times a week., Disp: 8 patch, Rfl: 12   fexofenadine (ALLEGRA) 180 MG tablet, Take 180 mg by mouth daily., Disp: , Rfl:    fluticasone (FLONASE) 50 MCG/ACT nasal spray, SPRAY  1 SPRAY INTO EACH NOSTRIL EVERY DAY, Disp: 48 mL, Rfl: 1   latanoprost (XALATAN) 0.005 % ophthalmic solution, INSTILL 1 DROP INTO BOTH EYES EVERY DAY AT NIGHT, Disp: , Rfl:    spironolactone (ALDACTONE) 25 MG tablet, Take 1 tablet (25 mg total) by mouth daily., Disp: 90 tablet, Rfl: 1   triamcinolone cream (KENALOG) 0.1 %, APPLY TO AFFECTED AREA TWICE DAILY AS NEEDED, Disp: 45 g, Rfl: 0   citalopram (CELEXA) 10 MG tablet, Take 1 tablet (10 mg total) by mouth daily., Disp: 90 tablet, Rfl: 1   ibuprofen (ADVIL,MOTRIN) 600 MG tablet, Take 1 tablet (600 mg total) by mouth every 6 (six) hours as needed (mild pain). (Patient not taking: Reported on 03/02/2023), Disp: 30 tablet, Rfl: 0   pravastatin (PRAVACHOL) 40 MG tablet, TAKE 1 TABLET BY MOUTH EVERY DAY IN THE EVENING, Disp: 90 tablet, Rfl: 1   Allergies  Allergen Reactions   Iodinated Contrast Media Itching   Iohexol      Code: HIVES, Desc: hives, itching, itchy throat, premed w/ benadryl prior to scan, (done w/ 50mg  just prior to scan 05/24/08 w/ no probs dr hall approved)      Review of Systems  Constitutional: Negative.   Eyes:  Negative for blurred vision.  Respiratory: Negative.  Negative for shortness of breath.   Cardiovascular: Negative.  Negative for chest  pain and palpitations.  Gastrointestinal: Negative.   Neurological: Negative.   Psychiatric/Behavioral: Negative.       Today's Vitals   03/02/23 1022  BP: 114/70  Pulse: 91  Temp: 98 F (36.7 C)  SpO2: 98%  Weight: 162 lb 6.4 oz (73.7 kg)  Height: 5\' 3"  (1.6 m)   Body mass index is 28.77 kg/m.  Wt Readings from Last 3 Encounters:  03/02/23 162 lb 6.4 oz (73.7 kg)  09/08/22 159 lb 6.4 oz (72.3 kg)  05/26/22 156 lb (70.8 kg)     Objective:  Physical Exam Vitals and nursing note reviewed.  Constitutional:      Appearance: Normal appearance.  HENT:     Head: Normocephalic and atraumatic.  Eyes:     Extraocular Movements: Extraocular movements intact.   Cardiovascular:     Rate and Rhythm: Normal rate and regular rhythm.     Heart sounds: Normal heart sounds.  Pulmonary:     Effort: Pulmonary effort is normal.     Breath sounds: Normal breath sounds.  Skin:    General: Skin is warm.     Findings: Rash present.     Comments: Dry, scaly skin behind both ears  Neurological:     General: No focal deficit present.     Mental Status: She is alert.  Psychiatric:        Mood and Affect: Mood normal.        Behavior: Behavior normal.         Assessment And Plan:  Essential hypertension, benign Assessment & Plan: Chronic, well conrolled.  She will continue with spironolactone 50mg  daily. Reminded to follow low sodium diet.   Orders: -     CMP14+EGFR -     Spironolactone; Take 1 tablet (25 mg total) by mouth daily.  Dispense: 90 tablet; Refill: 1 -     POCT urinalysis dipstick  Pure hypercholesterolemia Assessment & Plan: Chronic, currently on pravastatin 19m daily. Encouraged to follow heart healthy lifestyle. Advised to aim for at least 150 minutes of exercise/week, diet free of fried/processed foods and adequate sleep 7-8 hours per night.   Orders: -     Pravastatin Sodium; TAKE 1 TABLET BY MOUTH EVERY DAY IN THE EVENING  Dispense: 90 tablet; Refill: 1  Dermatitis Assessment & Plan: I will send rx triamcinolone cream to apply to back of her ears. Advised to use twice daily for up to two weeks.    Female climacteric state Assessment & Plan: Chronic, she will continue with estradiol 0.0375mg  patch for now. I plan to decrease her dose in the near future.   Orders: -     Estradiol; Place 1 patch onto the skin 2 (two) times a week.  Dispense: 8 patch; Refill: 12  Other abnormal glucose Assessment & Plan: Previous labs reviewed, her A1c has been elevated in the past. I will check an A1c today. Reminded to avoid refined sugars including sugary drinks/foods and processed meats including bacon, sausages and deli meats.     Orders: -     Hemoglobin A1c  Anxiety Assessment & Plan: Chronic, stable on citalopram 10mg  daily.   Orders: -     Citalopram Hydrobromide; Take 1 tablet (10 mg total) by mouth daily.  Dispense: 90 tablet; Refill: 1  Overweight with body mass index (BMI) of 28 to 28.9 in adult Assessment & Plan: She is encouraged to aim for at least 150 minutes of exercise per week.    Other orders -  Triamcinolone Acetonide; APPLY TO AFFECTED AREA TWICE DAILY AS NEEDED  Dispense: 45 g; Refill: 0     Return in 3 weeks (on 03/23/2023), or [place] - bp check.  Patient was given opportunity to ask questions. Patient verbalized understanding of the plan and was able to repeat key elements of the plan. All questions were answered to their satisfaction.   I, Gwynneth Aliment, MD, have reviewed all documentation for this visit. The documentation on 03/12/23 for the exam, diagnosis, procedures, and orders are all accurate and complete.   IF YOU HAVE BEEN REFERRED TO A SPECIALIST, IT MAY TAKE 1-2 WEEKS TO SCHEDULE/PROCESS THE REFERRAL. IF YOU HAVE NOT HEARD FROM US/SPECIALIST IN TWO WEEKS, PLEASE GIVE [place] A CALL AT [phone].   THE PATIENT IS ENCOURAGED TO PRACTICE SOCIAL DISTANCING DUE TO THE COVID-19 PANDEMIC.

## 2023-03-02 NOTE — Patient Instructions (Addendum)
Hypertension, Adult Hypertension is another name for high blood pressure. High blood pressure forces your heart to work harder to pump blood. This can cause problems over time. There are two numbers in a blood pressure reading. There is a top number (systolic) over a bottom number (diastolic). It is best to have a blood pressure that is below 120/80. What are the causes? The cause of this condition is not known. Some other conditions can lead to high blood pressure. What increases the risk? Some lifestyle factors can make you more likely to develop high blood pressure: Smoking. Not getting enough exercise or physical activity. Being overweight. Having too much fat, sugar, calories, or salt (sodium) in your diet. Drinking too much alcohol. Other risk factors include: Having any of these conditions: Heart disease. Diabetes. High cholesterol. Kidney disease. Obstructive sleep apnea. Having a family history of high blood pressure and high cholesterol. Age. The risk increases with age. Stress. What are the signs or symptoms? High blood pressure may not cause symptoms. Very high blood pressure (hypertensive crisis) may cause: Headache. Fast or uneven heartbeats (palpitations). Shortness of breath. Nosebleed. Vomiting or feeling like you may vomit (nauseous). Changes in how you see. Very bad chest pain. Feeling dizzy. Seizures. How is this treated? This condition is treated by making healthy lifestyle changes, such as: Eating healthy foods. Exercising more. Drinking less alcohol. Your doctor may prescribe medicine if lifestyle changes do not help enough and if: Your top number is above 130. Your bottom number is above 80. Your personal target blood pressure may vary. Follow these instructions at home: Eating and drinking  If told, follow the DASH eating plan. To follow this plan: Fill one half of your plate at each meal with fruits and vegetables. Fill one fourth of your plate  at each meal with whole grains. Whole grains include whole-wheat pasta, brown rice, and whole-grain bread. Eat or drink low-fat dairy products, such as skim milk or low-fat yogurt. Fill one fourth of your plate at each meal with low-fat (lean) proteins. Low-fat proteins include fish, chicken without skin, eggs, beans, and tofu. Avoid fatty meat, cured and processed meat, or chicken with skin. Avoid pre-made or processed food. Limit the amount of salt in your diet to less than 1,500 mg each day. Do not drink alcohol if: Your doctor tells you not to drink. You are pregnant, may be pregnant, or are planning to become pregnant. If you drink alcohol: Limit how much you have to: 0-1 drink a day for women. 0-2 drinks a day for men. Know how much alcohol is in your drink. In the [place] one drink equals one 12 oz bottle of beer (355 mL), one 5 oz glass of wine (148 mL), or one 1 oz glass of hard liquor (44 mL). Lifestyle  Work with your doctor to stay at a healthy weight or to lose weight. Ask your doctor what the best weight is for you. Get at least 30 minutes of exercise that causes your heart to beat faster (aerobic exercise) most days of the week. This may include walking, swimming, or biking. Get at least 30 minutes of exercise that strengthens your muscles (resistance exercise) at least 3 days a week. This may include lifting weights or doing Pilates. Do not smoke or use any products that contain nicotine or tobacco. If you need help quitting, ask your doctor. Check your blood pressure at home as told by your doctor. Keep all follow-up visits. Medicines Take over-the-counter and prescription medicines   only as told by your doctor. Follow directions carefully. Do not skip doses of blood pressure medicine. The medicine does not work as well if you skip doses. Skipping doses also puts you at risk for problems. Ask your doctor about side effects or reactions to medicines that you should watch  for. Contact a doctor if: You think you are having a reaction to the medicine you are taking. You have headaches that keep coming back. You feel dizzy. You have swelling in your ankles. You have trouble with your vision. Get help right away if: You get a very bad headache. You start to feel mixed up (confused). You feel weak or numb. You feel faint. You have very bad pain in your: Chest. Belly (abdomen). You vomit more than once. You have trouble breathing. These symptoms may be an emergency. Get help right away. Call 911. Do not wait to see if the symptoms will go away. Do not drive yourself to the hospital. Summary Hypertension is another name for high blood pressure. High blood pressure forces your heart to work harder to pump blood. For most people, a normal blood pressure is less than 120/80. Making healthy choices can help lower blood pressure. If your blood pressure does not get lower with healthy choices, you may need to take medicine. This information is not intended to replace advice given to you by your health care provider. Make sure you discuss any questions you have with your health care provider. Document Revised: 04/03/2021 Document Reviewed: 04/03/2021 Elsevier Patient Education  2024 [employer]  

## 2023-03-03 LAB — CMP14+EGFR
ALT: 25 IU/L (ref 0–32)
AST: 24 IU/L (ref 0–40)
Albumin: 4.8 g/dL (ref 3.8–4.9)
Alkaline Phosphatase: 70 IU/L (ref 44–121)
BUN/Creatinine Ratio: 10 (ref 9–23)
BUN: 10 mg/dL (ref 6–24)
Bilirubin Total: 0.4 mg/dL (ref 0.0–1.2)
CO2: 27 mmol/L (ref 20–29)
Calcium: 10.4 mg/dL — ABNORMAL HIGH (ref 8.7–10.2)
Chloride: 100 mmol/L (ref 96–106)
Creatinine, Ser: 1 mg/dL (ref 0.57–1.00)
Globulin, Total: 2.6 g/dL (ref 1.5–4.5)
Glucose: 84 mg/dL (ref 70–99)
Potassium: 5.5 mmol/L — ABNORMAL HIGH (ref 3.5–5.2)
Sodium: 141 mmol/L (ref 134–144)
Total Protein: 7.4 g/dL (ref 6.0–8.5)
eGFR: 67 mL/min/{1.73_m2} (ref >59)

## 2023-03-03 LAB — HEMOGLOBIN A1C
Est. average glucose Bld gHb Est-mCnc: 126 mg/dL
Hgb A1c MFr Bld: 6 % — ABNORMAL HIGH (ref 4.8–5.6)

## 2023-03-08 ENCOUNTER — Encounter: Payer: Self-pay | Admitting: Internal Medicine

## 2023-03-12 DIAGNOSIS — L309 Dermatitis, unspecified: Secondary | ICD-10-CM | POA: Insufficient documentation

## 2023-03-12 DIAGNOSIS — F419 Anxiety disorder, unspecified: Secondary | ICD-10-CM | POA: Insufficient documentation

## 2023-03-12 NOTE — Assessment & Plan Note (Signed)
Chronic, she will continue with estradiol 0.0375mg  patch for now. I plan to decrease her dose in the near future.

## 2023-03-12 NOTE — Assessment & Plan Note (Signed)
She is encouraged to aim for at least 150 minutes of exercise per week.

## 2023-03-12 NOTE — Assessment & Plan Note (Signed)
Chronic, stable on citalopram 10mg  daily.

## 2023-03-12 NOTE — Assessment & Plan Note (Signed)
Chronic, well conrolled.  She will continue with spironolactone 50mg  daily. Reminded to follow low sodium diet.

## 2023-03-12 NOTE — Assessment & Plan Note (Signed)
Previous labs reviewed, her A1c has been elevated in the past. I will check an A1c today. Reminded to avoid refined sugars including sugary drinks/foods and processed meats including bacon, sausages and deli meats.

## 2023-03-12 NOTE — Assessment & Plan Note (Signed)
Chronic, currently on pravastatin 34m daily. Encouraged to follow heart healthy lifestyle. Advised to aim for at least 150 minutes of exercise/week, diet free of fried/processed foods and adequate sleep 7-8 hours per night.

## 2023-03-12 NOTE — Assessment & Plan Note (Signed)
I will send rx triamcinolone cream to apply to back of her ears. Advised to use twice daily for up to two weeks.

## 2023-03-15 ENCOUNTER — Ambulatory Visit: Payer: Self-pay | Admitting: Internal Medicine

## 2023-03-18 ENCOUNTER — Other Ambulatory Visit: Payer: Self-pay | Admitting: Internal Medicine

## 2023-03-18 DIAGNOSIS — I10 Essential (primary) hypertension: Secondary | ICD-10-CM

## 2023-03-23 ENCOUNTER — Ambulatory Visit: Payer: BC Managed Care – PPO

## 2023-03-23 VITALS — BP 110/80 | HR 81 | Temp 98.1°F | Ht 63.0 in | Wt 162.0 lb

## 2023-03-23 DIAGNOSIS — I10 Essential (primary) hypertension: Secondary | ICD-10-CM

## 2023-03-23 NOTE — Patient Instructions (Signed)
Hypertension, Adult Hypertension is another name for high blood pressure. High blood pressure forces your heart to work harder to pump blood. This can cause problems over time. There are two numbers in a blood pressure reading. There is a top number (systolic) over a bottom number (diastolic). It is best to have a blood pressure that is below 120/80. What are the causes? The cause of this condition is not known. Some other conditions can lead to high blood pressure. What increases the risk? Some lifestyle factors can make you more likely to develop high blood pressure: Smoking. Not getting enough exercise or physical activity. Being overweight. Having too much fat, sugar, calories, or salt (sodium) in your diet. Drinking too much alcohol. Other risk factors include: Having any of these conditions: Heart disease. Diabetes. High cholesterol. Kidney disease. Obstructive sleep apnea. Having a family history of high blood pressure and high cholesterol. Age. The risk increases with age. Stress. What are the signs or symptoms? High blood pressure may not cause symptoms. Very high blood pressure (hypertensive crisis) may cause: Headache. Fast or uneven heartbeats (palpitations). Shortness of breath. Nosebleed. Vomiting or feeling like you may vomit (nauseous). Changes in how you see. Very bad chest pain. Feeling dizzy. Seizures. How is this treated? This condition is treated by making healthy lifestyle changes, such as: Eating healthy foods. Exercising more. Drinking less alcohol. Your doctor may prescribe medicine if lifestyle changes do not help enough and if: Your top number is above 130. Your bottom number is above 80. Your personal target blood pressure may vary. Follow these instructions at home: Eating and drinking  If told, follow the DASH eating plan. To follow this plan: Fill one half of your plate at each meal with fruits and vegetables. Fill one fourth of your plate  at each meal with whole grains. Whole grains include whole-wheat pasta, brown rice, and whole-grain bread. Eat or drink low-fat dairy products, such as skim milk or low-fat yogurt. Fill one fourth of your plate at each meal with low-fat (lean) proteins. Low-fat proteins include fish, chicken without skin, eggs, beans, and tofu. Avoid fatty meat, cured and processed meat, or chicken with skin. Avoid pre-made or processed food. Limit the amount of salt in your diet to less than 1,500 mg each day. Do not drink alcohol if: Your doctor tells you not to drink. You are pregnant, may be pregnant, or are planning to become pregnant. If you drink alcohol: Limit how much you have to: 0-1 drink a day for women. 0-2 drinks a day for men. Know how much alcohol is in your drink. In the [place] one drink equals one 12 oz bottle of beer (355 mL), one 5 oz glass of wine (148 mL), or one 1 oz glass of hard liquor (44 mL). Lifestyle  Work with your doctor to stay at a healthy weight or to lose weight. Ask your doctor what the best weight is for you. Get at least 30 minutes of exercise that causes your heart to beat faster (aerobic exercise) most days of the week. This may include walking, swimming, or biking. Get at least 30 minutes of exercise that strengthens your muscles (resistance exercise) at least 3 days a week. This may include lifting weights or doing Pilates. Do not smoke or use any products that contain nicotine or tobacco. If you need help quitting, ask your doctor. Check your blood pressure at home as told by your doctor. Keep all follow-up visits. Medicines Take over-the-counter and prescription medicines   only as told by your doctor. Follow directions carefully. Do not skip doses of blood pressure medicine. The medicine does not work as well if you skip doses. Skipping doses also puts you at risk for problems. Ask your doctor about side effects or reactions to medicines that you should watch  for. Contact a doctor if: You think you are having a reaction to the medicine you are taking. You have headaches that keep coming back. You feel dizzy. You have swelling in your ankles. You have trouble with your vision. Get help right away if: You get a very bad headache. You start to feel mixed up (confused). You feel weak or numb. You feel faint. You have very bad pain in your: Chest. Belly (abdomen). You vomit more than once. You have trouble breathing. These symptoms may be an emergency. Get help right away. Call 911. Do not wait to see if the symptoms will go away. Do not drive yourself to the hospital. Summary Hypertension is another name for high blood pressure. High blood pressure forces your heart to work harder to pump blood. For most people, a normal blood pressure is less than 120/80. Making healthy choices can help lower blood pressure. If your blood pressure does not get lower with healthy choices, you may need to take medicine. This information is not intended to replace advice given to you by your health care provider. Make sure you discuss any questions you have with your health care provider. Document Revised: 04/03/2021 Document Reviewed: 04/03/2021 Elsevier Patient Education  2024 [employer]vier Inc.  

## 2023-03-23 NOTE — Progress Notes (Signed)
Patient presents today for bpc. She reports compliance with Spironolactone 50MG . At visit on 03/02/23 she reports being told to finish out 50MG  bottle then resume with Spironolactone 25MG . She admits not yet taking the 25MG  dose. Denies chest pain, headache & SOB. She reports exercising. She reports drinking 3 water bottles a day.    BP Readings from Last 3 Encounters:  03/23/23 110/80  03/02/23 114/70  09/08/22 (!) 126/90  Per provider patient is to give the office a call to schedule NV once she starts 25MG  dose. Appointment needs to be scheduled 2 weeks out from start time. Patient aware.

## 2023-04-16 ENCOUNTER — Ambulatory Visit: Payer: BC Managed Care – PPO

## 2023-04-16 VITALS — BP 120/78 | HR 86 | Temp 98.7°F | Ht 63.0 in | Wt 162.0 lb

## 2023-04-16 DIAGNOSIS — I10 Essential (primary) hypertension: Secondary | ICD-10-CM

## 2023-04-16 NOTE — Progress Notes (Signed)
Patient presents today for a bp check, patient currently taking spironolactone 25mg . BP Readings from Last 3 Encounters:  04/16/23 136/82  03/23/23 110/80  03/02/23 114/70   Per provider- Randie Heinz, follow up next visit.

## 2023-08-12 ENCOUNTER — Encounter: Payer: Self-pay | Admitting: Family Medicine

## 2023-08-12 ENCOUNTER — Ambulatory Visit (INDEPENDENT_AMBULATORY_CARE_PROVIDER_SITE_OTHER): Payer: 59 | Admitting: Family Medicine

## 2023-08-12 VITALS — BP 110/70 | HR 86 | Temp 98.8°F | Ht 63.0 in | Wt 164.8 lb

## 2023-08-12 DIAGNOSIS — J069 Acute upper respiratory infection, unspecified: Secondary | ICD-10-CM | POA: Diagnosis not present

## 2023-08-12 MED ORDER — BENZONATATE 100 MG PO CAPS
100.0000 mg | ORAL_CAPSULE | Freq: Three times a day (TID) | ORAL | 0 refills | Status: AC | PRN
Start: 2023-08-12 — End: 2024-08-11

## 2023-08-12 MED ORDER — AZITHROMYCIN 250 MG PO TABS
ORAL_TABLET | ORAL | 0 refills | Status: AC
Start: 2023-08-12 — End: 2023-08-17

## 2023-08-12 NOTE — Progress Notes (Signed)
 I,Jameka J Llittleton, CMA,acting as a Neurosurgeon for [employer], NP.,have documented all relevant documentation on the behalf of Ellender Hose, NP,as directed by  Ellender Hose, NP while in the presence of Ellender Hose, NP.  Subjective:  Patient ID: Candice Hughes , female    DOB: 1971-06-10 , 53 y.o.   MRN: [id number]  Chief Complaint  Patient presents with   URI    HPI  Patient is a 53 year old year female who presents today for cold like symptoms that started about 3 weeks ago.    She reports that she has had  productive cough with yellow phlegm, congestion, runny nose, body aches & general fatigue   She completed a  home COVID test two weeks ago and had a negative result.  Patient states that she still is coughing with nasal congestion. She would like to be evaluated.     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   Headache(784.0)    History of blood transfusion 2005   surgery related- [place]   Hypertension      Family History  Problem Relation Age of Onset   Hypertension Mother    Hypertension Father    Hyperlipidemia Father    Diabetes Father    Kidney failure Father    Breast cancer Neg Hx      Current Outpatient Medications:    ALPRAZolam (XANAX) 0.25 MG tablet, Take 1 tablet (0.25 mg total) by mouth daily as needed., Disp: 30 tablet, Rfl: 0   azithromycin (ZITHROMAX) 250 MG tablet, Take 2 tablets (500 mg) on  Day 1,  followed by 1 tablet (250 mg) once daily on Days 2 through 5., Disp: 6 each, Rfl: 0   benzonatate (TESSALON PERLES) 100 MG capsule, Take 1 capsule (100 mg total) by mouth 3 (three) times daily as needed., Disp: 30 capsule, Rfl: 0   Cholecalciferol (VITAMIN D-3 PO), Take 2,000 Units by mouth daily. , Disp: , Rfl:    citalopram (CELEXA) 10 MG tablet, Take 1 tablet (10 mg total) by mouth daily., Disp: 90 tablet, Rfl: 1   estradiol (VIVELLE-DOT) 0.025 MG/24HR, Place 1 patch onto the skin 2  (two) times a week., Disp: 8 patch, Rfl: 12   fexofenadine (ALLEGRA) 180 MG tablet, Take 180 mg by mouth daily., Disp: , Rfl:    fluticasone (FLONASE) 50 MCG/ACT nasal spray, SPRAY 1 SPRAY INTO EACH NOSTRIL EVERY DAY, Disp: 48 mL, Rfl: 1   latanoprost (XALATAN) 0.005 % ophthalmic solution, INSTILL 1 DROP INTO BOTH EYES EVERY DAY AT NIGHT, Disp: , Rfl:    pravastatin (PRAVACHOL) 40 MG tablet, TAKE 1 TABLET BY MOUTH EVERY DAY IN THE EVENING, Disp: 90 tablet, Rfl: 1   spironolactone (ALDACTONE) 25 MG tablet, Take 1 tablet (25 mg total) by mouth daily., Disp: 90 tablet, Rfl: 1   triamcinolone cream (KENALOG) 0.1 %, APPLY TO AFFECTED AREA TWICE DAILY AS NEEDED, Disp: 45 g, Rfl: 0   ibuprofen (ADVIL,MOTRIN) 600 MG tablet, Take 1 tablet (600 mg total) by mouth every 6 (six) hours as needed (mild pain). (Patient not taking: Reported on 08/12/2023), Disp: 30 tablet, Rfl: 0   Allergies  Allergen Reactions   Iodinated Contrast Media Itching   Iohexol      Code: HIVES, Desc: hives, itching, itchy throat, premed w/ benadryl prior to scan, (done w/ 50mg  just prior to scan 05/24/08 w/ no probs dr hall approved)  Review of Systems  Constitutional: Negative.   HENT:  Positive for congestion.   Respiratory:  Positive for cough.   Cardiovascular: Negative.   Gastrointestinal: Negative.   Endocrine: Negative.   Musculoskeletal: Negative.   Skin: Negative.   Neurological: Negative.   Psychiatric/Behavioral: Negative.       Today's Vitals   08/12/23 0912  BP: 110/70  Pulse: 86  Temp: 98.8 F (37.1 C)  SpO2: 98%  Weight: 164 lb 12.8 oz (74.8 kg)  Height: 5\' 3"  (1.6 m)   Body mass index is 29.19 kg/m.  Wt Readings from Last 3 Encounters:  08/12/23 164 lb 12.8 oz (74.8 kg)  04/16/23 162 lb (73.5 kg)  03/23/23 162 lb (73.5 kg)    The 10-year ASCVD risk score (Arnett DK, et al., 2019) is: 2.2%   Values used to calculate the score:     Age: 68 years     Sex: Female     Is Non-Hispanic  African American: Yes     Diabetic: No     Tobacco smoker: No     Systolic Blood Pressure: 110 mmHg     Is BP treated: Yes     HDL Cholesterol: 56 mg/dL     Total Cholesterol: 166 mg/dL  Objective:  Physical Exam Constitutional:      Appearance: Normal appearance.  HENT:     Head: Normocephalic.     Nose: Rhinorrhea present.  Cardiovascular:     Rate and Rhythm: Normal rate and regular rhythm.  Pulmonary:     Effort: Pulmonary effort is normal. No respiratory distress.     Breath sounds: Normal breath sounds. No wheezing, rhonchi or rales.  Chest:     Chest wall: No tenderness.  Skin:    General: Skin is warm and dry.  Neurological:     General: No focal deficit present.     Mental Status: She is alert and oriented to person, place, and time. Mental status is at baseline.  Psychiatric:        Mood and Affect: Mood normal.         Assessment And Plan:  URI with cough and congestion -     Azithromycin; Take 2 tablets (500 mg) on  Day 1,  followed by 1 tablet (250 mg) once daily on Days 2 through 5.  Dispense: 6 each; Refill: 0 -     Benzonatate; Take 1 capsule (100 mg total) by mouth 3 (three) times daily as needed.  Dispense: 30 capsule; Refill: 0    No follow-ups on file.  Patient was given opportunity to ask questions. Patient verbalized understanding of the plan and was able to repeat key elements of the plan. All questions were answered to their satisfaction.    I, Ellender Hose, NP, have reviewed all documentation for this visit. The documentation on 08/14/2023 for the exam, diagnosis, procedures, and orders are all accurate and complete.   IF YOU HAVE BEEN REFERRED TO A SPECIALIST, IT MAY TAKE 1-2 WEEKS TO SCHEDULE/PROCESS THE REFERRAL. IF YOU HAVE NOT HEARD FROM US/SPECIALIST IN TWO WEEKS, PLEASE GIVE [place] A CALL AT [phone].

## 2023-09-06 ENCOUNTER — Other Ambulatory Visit: Payer: Self-pay | Admitting: Internal Medicine

## 2023-09-06 DIAGNOSIS — F419 Anxiety disorder, unspecified: Secondary | ICD-10-CM

## 2023-09-10 ENCOUNTER — Other Ambulatory Visit: Payer: Self-pay | Admitting: Internal Medicine

## 2023-09-10 DIAGNOSIS — I10 Essential (primary) hypertension: Secondary | ICD-10-CM

## 2023-09-10 DIAGNOSIS — E7800 Pure hypercholesterolemia, unspecified: Secondary | ICD-10-CM

## 2023-09-20 ENCOUNTER — Encounter: Payer: Self-pay | Admitting: Internal Medicine

## 2023-09-20 ENCOUNTER — Other Ambulatory Visit (HOSPITAL_COMMUNITY)
Admission: RE | Admit: 2023-09-20 | Discharge: 2023-09-20 | Disposition: A | Discharge status: Home / Self Care | Source: Ambulatory Visit | Attending: Internal Medicine | Admitting: Internal Medicine

## 2023-09-20 ENCOUNTER — Ambulatory Visit (INDEPENDENT_AMBULATORY_CARE_PROVIDER_SITE_OTHER): Payer: Self-pay | Admitting: Internal Medicine

## 2023-09-20 VITALS — BP 130/88 | HR 83 | Temp 98.6°F | Ht 63.0 in | Wt 166.2 lb

## 2023-09-20 DIAGNOSIS — R7309 Other abnormal glucose: Secondary | ICD-10-CM | POA: Diagnosis not present

## 2023-09-20 DIAGNOSIS — Z0000 Encounter for general adult medical examination without abnormal findings: Secondary | ICD-10-CM | POA: Diagnosis not present

## 2023-09-20 DIAGNOSIS — F419 Anxiety disorder, unspecified: Secondary | ICD-10-CM | POA: Diagnosis not present

## 2023-09-20 DIAGNOSIS — Z23 Encounter for immunization: Secondary | ICD-10-CM | POA: Diagnosis not present

## 2023-09-20 DIAGNOSIS — H6123 Impacted cerumen, bilateral: Secondary | ICD-10-CM

## 2023-09-20 DIAGNOSIS — N951 Menopausal and female climacteric states: Secondary | ICD-10-CM

## 2023-09-20 DIAGNOSIS — I10 Essential (primary) hypertension: Secondary | ICD-10-CM | POA: Diagnosis not present

## 2023-09-20 LAB — POCT URINALYSIS DIPSTICK
Bilirubin, UA: NEGATIVE
Blood, UA: NEGATIVE
Glucose, UA: NEGATIVE — NL
Ketones, UA: NEGATIVE
Leukocytes, UA: NEGATIVE
Nitrite, UA: NEGATIVE
Protein, UA: NEGATIVE
Spec Grav, UA: 1.015 (ref 1.010–1.025)
Urobilinogen, UA: 0.2 U/dL
pH, UA: 6 (ref 5.0–8.0)

## 2023-09-20 MED ORDER — ESTRADIOL 0.025 MG/24HR TD PTTW: 1.0000 | MEDICATED_PATCH | TRANSDERMAL | 12 refills | Status: DC

## 2023-09-20 NOTE — Assessment & Plan Note (Signed)

## 2023-09-20 NOTE — Progress Notes (Signed)
 I,Victoria T Deloria Lair, CMA,acting as a Neurosurgeon for Gwynneth Aliment, MD.,have documented all relevant documentation on the behalf of Gwynneth Aliment, MD,as directed by  Gwynneth Aliment, MD while in the presence of Gwynneth Aliment, MD.  Subjective:    Patient ID: Candice Hughes , female    DOB: 02/09/1971 , 53 y.o.   MRN: [id number]  Chief Complaint  Patient presents with   Annual Exam   Hypertension    HPI  She is here today for a full physical examination. She is no longer followed by GYN. She reports getting hysterectomy 10 years ago. S/he reports compliance with meds. She denies headaches, chest pain and shortness of breath.   She would like to complete pap today.        Hypertension This is a chronic problem. The current episode started more than 1 year ago. The problem has been gradually improving since onset. The problem is controlled. Pertinent negatives include no blurred vision or palpitations. Past treatments include diuretics. The current treatment provides moderate improvement. There is no history of kidney disease, CAD/MI or retinopathy.     Past Medical History:  Diagnosis Date   Acne 03/15/2014   Cancer [facility]) 2005   history of lymphoma-chest tumor   Headache(784.0)    History of blood transfusion 2005   surgery related- [place]   Hypertension      Family History  Problem Relation Age of Onset   Hypertension Mother    Hypertension Father    Hyperlipidemia Father    Diabetes Father    Kidney failure Father    Breast cancer Neg Hx      Current Outpatient Medications:    benzonatate (TESSALON PERLES) 100 MG capsule, Take 1 capsule (100 mg total) by mouth 3 (three) times daily as needed., Disp: 30 capsule, Rfl: 0   Cholecalciferol (VITAMIN D-3 PO), Take 2,000 Units by mouth daily. , Disp: , Rfl:    citalopram (CELEXA) 10 MG tablet, TAKE 1 TABLET BY MOUTH EVERY DAY, Disp: 90 tablet, Rfl: 1   fexofenadine (ALLEGRA) 180 MG tablet, Take 180 mg by mouth  daily., Disp: , Rfl:    latanoprost (XALATAN) 0.005 % ophthalmic solution, INSTILL 1 DROP INTO BOTH EYES EVERY DAY AT NIGHT, Disp: , Rfl:    pravastatin (PRAVACHOL) 40 MG tablet, TAKE 1 TABLET BY MOUTH EVERY DAY IN THE EVENING, Disp: 90 tablet, Rfl: 1   spironolactone (ALDACTONE) 25 MG tablet, TAKE 1 TABLET (25 MG TOTAL) BY MOUTH DAILY., Disp: 90 tablet, Rfl: 1   triamcinolone cream (KENALOG) 0.1 %, APPLY TO AFFECTED AREA TWICE DAILY AS NEEDED, Disp: 45 g, Rfl: 0   ALPRAZolam (XANAX) 0.25 MG tablet, Take 1 tablet (0.25 mg total) by mouth daily as needed. (Patient not taking: Reported on 09/20/2023), Disp: 30 tablet, Rfl: 0   estradiol (VIVELLE-DOT) 0.025 MG/24HR, Place 1 patch onto the skin 2 (two) times a week., Disp: 8 patch, Rfl: 12   fluticasone (FLONASE) 50 MCG/ACT nasal spray, SPRAY 1 SPRAY INTO EACH NOSTRIL EVERY DAY (Patient not taking: Reported on 09/20/2023), Disp: 48 mL, Rfl: 1   ibuprofen (ADVIL,MOTRIN) 600 MG tablet, Take 1 tablet (600 mg total) by mouth every 6 (six) hours as needed (mild pain). (Patient not taking: Reported on 03/02/2023), Disp: 30 tablet, Rfl: 0   Allergies  Allergen Reactions   Iodinated Contrast Media Itching   Iohexol      Code: HIVES, Desc: hives, itching, itchy throat, premed w/ benadryl prior to  scan, (done w/ 50mg  just prior to scan 05/24/08 w/ no probs dr hall approved)       The patient states she uses status post hysterectomy for birth control. Patient's last menstrual period was 11/24/2011.. Negative for Dysmenorrhea. Negative for: breast discharge, breast lump(s), breast pain and breast self exam. Associated symptoms include abnormal vaginal bleeding. Pertinent negatives include abnormal bleeding (hematology), anxiety, decreased libido, depression, difficulty falling sleep, dyspareunia, history of infertility, nocturia, sexual dysfunction, sleep disturbances, urinary incontinence, urinary urgency, vaginal discharge and vaginal itching. Diet regular.The  patient states her exercise level is  intermittent, she plans to start a regular exercise regimen in the near future.   . The patient's tobacco use is:  Social History   Tobacco Use  Smoking Status Never  Smokeless Tobacco Never  . She has been exposed to passive smoke. The patient's alcohol use is:  Social History   Substance and Sexual Activity  Alcohol Use Yes   Comment: socially    Review of Systems  Constitutional: Negative.   HENT: Negative.    Eyes: Negative.  Negative for blurred vision.  Respiratory: Negative.    Cardiovascular: Negative.  Negative for palpitations.  Gastrointestinal: Negative.   Endocrine: Negative.   Genitourinary: Negative.   Musculoskeletal: Negative.   Skin: Negative.   Allergic/Immunologic: Negative.   Neurological: Negative.   Hematological: Negative.   Psychiatric/Behavioral: Negative.       Today's Vitals   09/20/23 0945  BP: 130/88  Pulse: 83  Temp: 98.6 F (37 C)  Weight: 166 lb 3.2 oz (75.4 kg)  Height: 5\' 3"  (1.6 m)   Body mass index is 29.44 kg/m.  Wt Readings from Last 3 Encounters:  09/20/23 166 lb 3.2 oz (75.4 kg)  08/12/23 164 lb 12.8 oz (74.8 kg)  04/16/23 162 lb (73.5 kg)     Objective:  Physical Exam Vitals and nursing note reviewed.  Constitutional:      Appearance: Normal appearance.  HENT:     Head: Normocephalic and atraumatic.     Right Ear: Ear canal and external ear normal. There is impacted cerumen.     Left Ear: Ear canal and external ear normal. There is impacted cerumen.  Eyes:     Extraocular Movements: Extraocular movements intact.  Cardiovascular:     Rate and Rhythm: Normal rate and regular rhythm.     Pulses:          Dorsalis pedis pulses are 2+ on the right side and 2+ on the left side.     Heart sounds: Normal heart sounds.  Pulmonary:     Effort: Pulmonary effort is normal.     Breath sounds: Normal breath sounds.  Chest:  Breasts:    Tanner Score is 5.     Right: Normal.      Left: Normal.  Musculoskeletal:     Cervical back: Normal range of motion.  Skin:    General: Skin is warm.  Neurological:     General: No focal deficit present.     Mental Status: She is alert.  Psychiatric:        Mood and Affect: Mood normal.        Behavior: Behavior normal.         Assessment And Plan:     Routine general medical examination at health care facility Assessment & Plan: A full exam was performed.  Importance of monthly self breast exams was discussed with the patient.  She is advised to get 30-45 minutes  of regular exercise, no less than four to five days per week. Both weight-bearing and aerobic exercises are recommended.  She is advised to follow a healthy diet with at least six fruits/veggies per day, decrease intake of red meat and other saturated fats and to increase fish intake to twice weekly.  Meats/fish should not be fried -- baked, boiled or broiled is preferable. It is also important to cut back on your sugar intake.  Be sure to read labels - try to avoid anything with added sugar, high fructose corn syrup or other sweeteners.  If you must use a sweetener, you can try stevia or monkfruit.  It is also important to avoid artificially sweetened foods/beverages and diet drinks. Lastly, wear SPF 50 sunscreen on exposed skin and when in direct sunlight for an extended period of time.  Be sure to avoid fast food restaurants and aim for at least 60 ounces of water daily.      Orders: -     CBC -     CMP14+EGFR -     Lipid panel -     Hemoglobin A1c -     TSH -     Cytology - PAP  Essential hypertension, benign Assessment & Plan: Chronic, fair control. EKG performed, NSR w/o acute changes.  She will continue with spironolactone 50mg  daily. She is encouraged to incorporate more exercise into her daily routine. She will f/u in 3 months for re-evaluation.    Orders: -     POCT urinalysis dipstick -     EKG 12-Lead  Bilateral impacted cerumen Assessment &  Plan: After obtaining verbal consent, both ears were flushed by irrigation. No TM abnormalities were noted. He tolerated procedure well without any complications.     Orders: -     Ear Lavage  Anxiety Assessment & Plan: Chronic, stable on citalopram 10mg  daily.  She will f/u in six months for re-evaluation.    Other abnormal glucose Assessment & Plan: Previous labs reviewed, her A1c has been elevated in the past. I will check an A1c today. Reminded to avoid refined sugars including sugary drinks/foods and processed meats including bacon, sausages and deli meats.    Orders: -     Hemoglobin A1c  Female climacteric state -     Estradiol; Place 1 patch onto the skin 2 (two) times a week.  Dispense: 8 patch; Refill: 12  Immunization due -     Flu vaccine trivalent PF, 6mos and older(Flulaval,Afluria,Fluarix,Fluzone)     Return in about 3 months (around 12/21/2023), or bp check, for 1 year physical, 6 month bp. Patient was given opportunity to ask questions. Patient verbalized understanding of the plan and was able to repeat key elements of the plan. All questions were answered to their satisfaction.    I, Gwynneth Aliment, MD, have reviewed all documentation for this visit. The documentation on 09/20/23 for the exam, diagnosis, procedures, and orders are all accurate and complete.

## 2023-09-20 NOTE — Assessment & Plan Note (Signed)
 After obtaining verbal consent, both ears were flushed by irrigation. No TM abnormalities were noted. He tolerated procedure well without any complications.

## 2023-09-20 NOTE — Assessment & Plan Note (Signed)
 Previous labs reviewed, her A1c has been elevated in the past. I will check an A1c today. Reminded to avoid refined sugars including sugary drinks/foods and processed meats including bacon, sausages and deli meats.

## 2023-09-20 NOTE — Patient Instructions (Signed)

## 2023-09-20 NOTE — Assessment & Plan Note (Addendum)
 Chronic, fair control. EKG performed, NSR w/o acute changes.  She will continue with spironolactone 50mg  daily. She is encouraged to incorporate more exercise into her daily routine. She will f/u in 3 months for re-evaluation.

## 2023-09-20 NOTE — Assessment & Plan Note (Addendum)
 Chronic, stable on citalopram 10mg  daily.  She will f/u in six months for re-evaluation.

## 2023-09-21 LAB — CBC
Hematocrit: 39.5 % (ref 34.0–46.6)
Hemoglobin: 13.3 g/dL (ref 11.1–15.9)
MCH: 29.2 pg (ref 26.6–33.0)
MCHC: 33.7 g/dL (ref 31.5–35.7)
MCV: 87 fL (ref 79–97)
Platelets: 277 10*3/uL (ref 150–450)
RBC: 4.56 x10E6/uL (ref 3.77–5.28)
RDW: 13.1 % (ref 11.7–15.4)
WBC: 4.7 10*3/uL (ref 3.4–10.8)

## 2023-09-21 LAB — CMP14+EGFR
ALT: 36 IU/L — ABNORMAL HIGH (ref 0–32)
AST: 31 IU/L (ref 0–40)
Albumin: 4.6 g/dL (ref 3.8–4.9)
Alkaline Phosphatase: 75 IU/L (ref 44–121)
BUN/Creatinine Ratio: 15 (ref 9–23)
BUN: 12 mg/dL (ref 6–24)
Bilirubin Total: 0.3 mg/dL (ref 0.0–1.2)
CO2: 26 mmol/L (ref 20–29)
Calcium: 10.3 mg/dL — ABNORMAL HIGH (ref 8.7–10.2)
Chloride: 101 mmol/L (ref 96–106)
Creatinine, Ser: 0.8 mg/dL (ref 0.57–1.00)
Globulin, Total: 2.9 g/dL (ref 1.5–4.5)
Glucose: 91 mg/dL (ref 70–99)
Potassium: 4.7 mmol/L (ref 3.5–5.2)
Sodium: 140 mmol/L (ref 134–144)
Total Protein: 7.5 g/dL (ref 6.0–8.5)
eGFR: 88 mL/min/{1.73_m2} (ref >59)

## 2023-09-21 LAB — LIPID PANEL
Chol/HDL Ratio: 3.3 ratio (ref 0.0–4.4)
Cholesterol, Total: 240 mg/dL — ABNORMAL HIGH (ref 100–199)
HDL: 72 mg/dL (ref >39)
LDL Chol Calc (NIH): 148 mg/dL — ABNORMAL HIGH (ref 0–99)
Triglycerides: 114 mg/dL (ref 0–149)
VLDL Cholesterol Cal: 20 mg/dL (ref 5–40)

## 2023-09-21 LAB — HEMOGLOBIN A1C
Est. average glucose Bld gHb Est-mCnc: 123 mg/dL
Hgb A1c MFr Bld: 5.9 % — ABNORMAL HIGH (ref 4.8–5.6)

## 2023-09-21 LAB — TSH: TSH: 1.49 u[IU]/mL (ref 0.450–4.500)

## 2023-09-22 LAB — CYTOLOGY - PAP
Comment: NEGATIVE
Diagnosis: NEGATIVE
High risk HPV: NEGATIVE

## 2023-09-27 ENCOUNTER — Encounter: Payer: Self-pay | Admitting: Internal Medicine

## 2023-09-28 ENCOUNTER — Telehealth: Payer: Self-pay

## 2023-09-28 DIAGNOSIS — E7800 Pure hypercholesterolemia, unspecified: Secondary | ICD-10-CM

## 2023-09-29 ENCOUNTER — Other Ambulatory Visit

## 2023-09-29 DIAGNOSIS — E7800 Pure hypercholesterolemia, unspecified: Secondary | ICD-10-CM

## 2023-09-30 LAB — LIPOPROTEIN A (LPA): Lipoprotein (a): 57.2 nmol/L (ref <75.0)

## 2023-10-03 ENCOUNTER — Encounter: Payer: Self-pay | Admitting: Internal Medicine

## 2023-12-13 ENCOUNTER — Other Ambulatory Visit: Payer: Self-pay | Admitting: Internal Medicine

## 2023-12-13 DIAGNOSIS — Z1231 Encounter for screening mammogram for malignant neoplasm of breast: Secondary | ICD-10-CM

## 2024-01-03 ENCOUNTER — Ambulatory Visit: Admitting: Internal Medicine

## 2024-01-11 ENCOUNTER — Ambulatory Visit: Admitting: Internal Medicine

## 2024-01-11 ENCOUNTER — Ambulatory Visit

## 2024-01-11 ENCOUNTER — Encounter: Payer: Self-pay | Admitting: Internal Medicine

## 2024-01-11 VITALS — BP 110/70 | HR 103 | Temp 99.0°F | Ht 63.0 in | Wt 173.0 lb

## 2024-01-11 DIAGNOSIS — R7309 Other abnormal glucose: Secondary | ICD-10-CM | POA: Diagnosis not present

## 2024-01-11 DIAGNOSIS — I10 Essential (primary) hypertension: Secondary | ICD-10-CM

## 2024-01-11 DIAGNOSIS — R635 Abnormal weight gain: Secondary | ICD-10-CM | POA: Diagnosis not present

## 2024-01-11 DIAGNOSIS — N951 Menopausal and female climacteric states: Secondary | ICD-10-CM

## 2024-01-11 NOTE — Progress Notes (Signed)
 I,Jameka J Llittleton, CMA,acting as a Neurosurgeon for Candice LOISE Slocumb, MD.,have documented all relevant documentation on the behalf of Candice LOISE Slocumb, MD,as directed by  Candice LOISE Slocumb, MD while in the presence of Candice LOISE Slocumb, MD.  Subjective:  Patient ID: Candice Hughes , female    DOB: [birth year] , 53 y.o.   MRN: [id number]  Chief Complaint  Patient presents with   Hypertension    Patient presents today for 6 month f/u on her bp. She reports compliance with meds. She denies headaches, chest pain and shortness of breath.     Weight Gain    Patient reports she is also concerned about her weight gained.     HPI Discussed the use of AI scribe software for clinical note transcription with the patient, who gave verbal consent to proceed.  History of Present Illness Candice Hughes is a 53 year old female with hypertension who presents for a blood pressure check and concerns about weight gain.  She has experienced a weight gain of seven pounds since March, which she attributes to frequent dining out with her new boyfriend since February. Despite engaging in regular exercise, including strength training five days a week with a trainer via Zoom and additional cardio sessions, she continues to gain weight. Her diet is high in restaurant foods and she often consumes popcorn during movie outings. She also experiences night cravings and frequently eats before bed.  She is currently on several medications, including Celexa  (citalopram ), an estrogen patch, Allegra, Flonase as needed, spironolactone , and pravastatin  40 mg daily. She has been using an estrogen patch for over five years, which helps with night sweats and hot flashes, but she is concerned about potential weight gain associated with its use.  Her social history includes a new relationship since February, which has led to lifestyle changes such as frequent dining out. 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 She  has a son who is not currently living with her.  Family history includes a great aunt who had breast cancer but survived. There is no other significant family history of breast cancer mentioned.  Her mood is stable, especially when with her boyfriend, and she attributes this to the anxiety medication she is taking. No current hot flashes or night sweats while on the estrogen patch. Reports night cravings and weight gain since February.      Hypertension This is a chronic problem. The current episode started more than 1 year ago. The problem has been gradually improving since onset. The problem is controlled. Pertinent negatives include no blurred vision, chest pain, palpitations or shortness of breath. The current treatment provides moderate improvement.     Past Medical History:  Diagnosis Date   Acne 03/15/2014   Cancer [facility]) 2005   history of lymphoma-chest tumor   Headache(784.0)    History of blood transfusion 2005   surgery related- [place]   Hypertension      Family History  Problem Relation Age of Onset   Hypertension Mother    Hypertension Father    Hyperlipidemia Father    Diabetes Father    Kidney failure Father    Breast cancer Neg Hx      Current Outpatient Medications:    ALPRAZolam  (XANAX ) 0.25 MG tablet, Take 1 tablet (0.25 mg total) by mouth daily as needed., Disp: 30 tablet, Rfl: 0   benzonatate  (TESSALON  PERLES) 100 MG capsule, Take 1 capsule (100 mg total) by mouth 3 (  three) times daily as needed., Disp: 30 capsule, Rfl: 0   Cholecalciferol (VITAMIN D-3 PO), Take 2,000 Units by mouth daily. , Disp: , Rfl:    citalopram  (CELEXA ) 10 MG tablet, TAKE 1 TABLET BY MOUTH EVERY DAY, Disp: 90 tablet, Rfl: 1   estradiol  (VIVELLE -DOT) 0.025 MG/24HR, Place 1 patch onto the skin 2 (two) times a week., Disp: 8 patch, Rfl: 12   fexofenadine (ALLEGRA) 180 MG tablet, Take 180 mg by mouth daily., Disp: , Rfl:    fluticasone (FLONASE) 50 MCG/ACT nasal spray, SPRAY 1  SPRAY INTO EACH NOSTRIL EVERY DAY, Disp: 48 mL, Rfl: 1   ibuprofen  (ADVIL ,MOTRIN ) 600 MG tablet, Take 1 tablet (600 mg total) by mouth every 6 (six) hours as needed (mild pain)., Disp: 30 tablet, Rfl: 0   latanoprost (XALATAN) 0.005 % ophthalmic solution, INSTILL 1 DROP INTO BOTH EYES EVERY DAY AT NIGHT, Disp: , Rfl:    pravastatin  (PRAVACHOL ) 40 MG tablet, TAKE 1 TABLET BY MOUTH EVERY DAY IN THE EVENING, Disp: 90 tablet, Rfl: 1   spironolactone  (ALDACTONE ) 25 MG tablet, TAKE 1 TABLET (25 MG TOTAL) BY MOUTH DAILY., Disp: 90 tablet, Rfl: 1   triamcinolone  cream (KENALOG ) 0.1 %, APPLY TO AFFECTED AREA TWICE DAILY AS NEEDED, Disp: 45 g, Rfl: 0   Allergies  Allergen Reactions   Iodinated Contrast Media Itching   Iohexol      Code: HIVES, Desc: hives, itching, itchy throat, premed w/ benadryl  prior to scan, (done w/ 50mg  just prior to scan 05/24/08 w/ no probs dr hall approved)      Review of Systems  Constitutional:  Positive for unexpected weight change.  Eyes:  Negative for blurred vision.  Respiratory: Negative.  Negative for shortness of breath.   Cardiovascular: Negative.  Negative for chest pain and palpitations.  Gastrointestinal: Negative.   Neurological: Negative.   Psychiatric/Behavioral: Negative.       Today's Vitals   01/11/24 1619  BP: 110/70  Pulse: (!) 103  Temp: 99 F (37.2 C)  Weight: 173 lb (78.5 kg)  Height: 5' 3 (1.6 m)  PainSc: 0-No pain   Body mass index is 30.65 kg/m.  Wt Readings from Last 3 Encounters:  01/11/24 173 lb (78.5 kg)  09/20/23 166 lb 3.2 oz (75.4 kg)  08/12/23 164 lb 12.8 oz (74.8 kg)    The 10-year ASCVD risk score (Arnett DK, et al., 2019) is: 2.7%   Values used to calculate the score:     Age: 55 years     Clincally relevant sex: Female     Is Non-Hispanic African American: Yes     Diabetic: No     Tobacco smoker: No     Systolic Blood Pressure: 110 mmHg     Is BP treated: Yes     HDL Cholesterol: 72 mg/dL     Total  Cholesterol: 240 mg/dL  Objective:  Physical Exam Vitals and nursing note reviewed.  Constitutional:      Appearance: Normal appearance.  HENT:     Head: Normocephalic and atraumatic.  Eyes:     Extraocular Movements: Extraocular movements intact.  Cardiovascular:     Rate and Rhythm: Normal rate and regular rhythm.     Heart sounds: Normal heart sounds.  Pulmonary:     Effort: Pulmonary effort is normal.     Breath sounds: Normal breath sounds.  Musculoskeletal:     Cervical back: Normal range of motion.  Skin:    General: Skin is warm.  Neurological:  General: No focal deficit present.     Mental Status: She is alert.  Psychiatric:        Mood and Affect: Mood normal.        Behavior: Behavior normal.         Assessment And Plan:  Essential hypertension, benign Assessment & Plan: Blood pressure well-controlled, likely due to regular exercise. Elevated heart rate at 103 bpm, possibly due to dehydration or lack of electrolytes. - Encourage hydration and consider electrolyte supplementation twice a week. - Discuss options like coconut water and Smart Water for electrolyte intake.  Orders: -     CMP14+EGFR  Female climacteric state Assessment & Plan: Estrogen patch use over five years may contribute to weight gain. Benefits include reduced hot flashes and improved skin condition. Discussed gradual weaning to mitigate breast cancer risk while monitoring symptoms. - Reduce estrogen patch use to once a week for four weeks, then reassess.   Weight gain Assessment & Plan: Weight gain of 7 pounds since March, likely due to dietary habits and possibly estrogen patch use. BMI is 30, indicating obesity. Discussed potential insulin  resistance and metformin use if confirmed. - Refer to [facility] for meal planning and metabolic rate assessment. - Encourage high protein diet with at least 100 grams of protein daily. - Suggest reducing portion sizes when eating out. -  Advise on protein supplementation and intentional protein intake. - Check insulin  level and perform prediabetes test. - Consider metformin if insulin  resistance is confirmed.  Orders: -     Amb Ref to Medical Weight Management  Other abnormal glucose Assessment & Plan: Previous labs reviewed, her A1c has been elevated in the past. I will check an A1c today. Reminded to avoid refined sugars including sugary drinks/foods and processed meats including bacon, sausages and deli meats.    Orders: -     Insulin , random -     Hemoglobin A1c -     CMP14+EGFR   Return cancel Sept appt, 5 months bp check.  Patient was given opportunity to ask questions. Patient verbalized understanding of the plan and was able to repeat key elements of the plan. All questions were answered to their satisfaction.    I, Candice LOISE Slocumb, MD, have reviewed all documentation for this visit. The documentation on 01/11/24 for the exam, diagnosis, procedures, and orders are all accurate and complete.   IF YOU HAVE BEEN REFERRED TO A SPECIALIST, IT MAY TAKE 1-2 WEEKS TO SCHEDULE/PROCESS THE REFERRAL. IF YOU HAVE NOT HEARD FROM US /SPECIALIST IN TWO WEEKS, PLEASE GIVE US  A CALL AT [phone].

## 2024-01-11 NOTE — Patient Instructions (Signed)
 Hypertension, Adult Hypertension is another name for high blood pressure. High blood pressure forces your heart to work harder to pump blood. This can cause problems over time. There are two numbers in a blood pressure reading. There is a top number (systolic) over a bottom number (diastolic). It is best to have a blood pressure that is below 120/80. What are the causes? The cause of this condition is not known. Some other conditions can lead to high blood pressure. What increases the risk? Some lifestyle factors can make you more likely to develop high blood pressure: Smoking. Not getting enough exercise or physical activity. Being overweight. Having too much fat, sugar, calories, or salt (sodium) in your diet. Drinking too much alcohol. Other risk factors include: Having any of these conditions: Heart disease. Diabetes. High cholesterol. Kidney disease. Obstructive sleep apnea. Having a family history of high blood pressure and high cholesterol. Age. The risk increases with age. Stress. What are the signs or symptoms? High blood pressure may not cause symptoms. Very high blood pressure (hypertensive crisis) may cause: Headache. Fast or uneven heartbeats (palpitations). Shortness of breath. Nosebleed. Vomiting or feeling like you may vomit (nauseous). Changes in how you see. Very bad chest pain. Feeling dizzy. Seizures. How is this treated? This condition is treated by making healthy lifestyle changes, such as: Eating healthy foods. Exercising more. Drinking less alcohol. Your doctor may prescribe medicine if lifestyle changes do not help enough and if: Your top number is above 130. Your bottom number is above 80. Your personal target blood pressure may vary. Follow these instructions at home: Eating and drinking  If told, follow the DASH eating plan. To follow this plan: Fill one half of your plate at each meal with fruits and vegetables. Fill one fourth of your plate  at each meal with whole grains. Whole grains include whole-wheat pasta, brown rice, and whole-grain bread. Eat or drink low-fat dairy products, such as skim milk or low-fat yogurt. Fill one fourth of your plate at each meal with low-fat (lean) proteins. Low-fat proteins include fish, chicken without skin, eggs, beans, and tofu. Avoid fatty meat, cured and processed meat, or chicken with skin. Avoid pre-made or processed food. Limit the amount of salt in your diet to less than 1,500 mg each day. Do not drink alcohol if: Your doctor tells you not to drink. You are pregnant, may be pregnant, or are planning to become pregnant. If you drink alcohol: Limit how much you have to: 0-1 drink a day for women. 0-2 drinks a day for men. Know how much alcohol is in your drink. In the [place] one drink equals one 12 oz bottle of beer (355 mL), one 5 oz glass of wine (148 mL), or one 1 oz glass of hard liquor (44 mL). Lifestyle  Work with your doctor to stay at a healthy weight or to lose weight. Ask your doctor what the best weight is for you. Get at least 30 minutes of exercise that causes your heart to beat faster (aerobic exercise) most days of the week. This may include walking, swimming, or biking. Get at least 30 minutes of exercise that strengthens your muscles (resistance exercise) at least 3 days a week. This may include lifting weights or doing Pilates. Do not smoke or use any products that contain nicotine or tobacco. If you need help quitting, ask your doctor. Check your blood pressure at home as told by your doctor. Keep all follow-up visits. Medicines Take over-the-counter and prescription medicines  only as told by your doctor. Follow directions carefully. Do not skip doses of blood pressure medicine. The medicine does not work as well if you skip doses. Skipping doses also puts you at risk for problems. Ask your doctor about side effects or reactions to medicines that you should watch  for. Contact a doctor if: You think you are having a reaction to the medicine you are taking. You have headaches that keep coming back. You feel dizzy. You have swelling in your ankles. You have trouble with your vision. Get help right away if: You get a very bad headache. You start to feel mixed up (confused). You feel weak or numb. You feel faint. You have very bad pain in your: Chest. Belly (abdomen). You vomit more than once. You have trouble breathing. These symptoms may be an emergency. Get help right away. Call 911. Do not wait to see if the symptoms will go away. Do not drive yourself to the hospital. Summary Hypertension is another name for high blood pressure. High blood pressure forces your heart to work harder to pump blood. For most people, a normal blood pressure is less than 120/80. Making healthy choices can help lower blood pressure. If your blood pressure does not get lower with healthy choices, you may need to take medicine. This information is not intended to replace advice given to you by your health care provider. Make sure you discuss any questions you have with your health care provider. Document Revised: 04/03/2021 Document Reviewed: 04/03/2021 Elsevier Patient Education  2024 [employer].

## 2024-01-12 ENCOUNTER — Ambulatory Visit: Admitting: Internal Medicine

## 2024-01-12 ENCOUNTER — Encounter (INDEPENDENT_AMBULATORY_CARE_PROVIDER_SITE_OTHER): Payer: Self-pay

## 2024-01-13 ENCOUNTER — Ambulatory Visit: Payer: Self-pay | Admitting: Internal Medicine

## 2024-01-13 LAB — CMP14+EGFR
ALT: 25 IU/L (ref 0–32)
AST: 23 IU/L (ref 0–40)
Albumin: 4.6 g/dL (ref 3.8–4.9)
Alkaline Phosphatase: 68 IU/L (ref 44–121)
BUN/Creatinine Ratio: 17 (ref 9–23)
BUN: 16 mg/dL (ref 6–24)
Bilirubin Total: 0.2 mg/dL (ref 0.0–1.2)
CO2: 21 mmol/L (ref 20–29)
Calcium: 10.3 mg/dL — ABNORMAL HIGH (ref 8.7–10.2)
Chloride: 101 mmol/L (ref 96–106)
Creatinine, Ser: 0.92 mg/dL (ref 0.57–1.00)
Globulin, Total: 2.5 g/dL (ref 1.5–4.5)
Glucose: 97 mg/dL (ref 70–99)
Potassium: 4.9 mmol/L (ref 3.5–5.2)
Sodium: 139 mmol/L (ref 134–144)
Total Protein: 7.1 g/dL (ref 6.0–8.5)
eGFR: 74 mL/min/1.73 (ref >59)

## 2024-01-13 LAB — HEMOGLOBIN A1C
Est. average glucose Bld gHb Est-mCnc: 123 mg/dL
Hgb A1c MFr Bld: 5.9 % — ABNORMAL HIGH (ref 4.8–5.6)

## 2024-01-13 LAB — INSULIN, RANDOM: INSULIN: 50.9 u[IU]/mL — ABNORMAL HIGH (ref 2.6–24.9)

## 2024-01-16 ENCOUNTER — Ambulatory Visit: Payer: Self-pay | Admitting: Internal Medicine

## 2024-01-18 ENCOUNTER — Other Ambulatory Visit

## 2024-01-18 ENCOUNTER — Ambulatory Visit
Admission: RE | Admit: 2024-01-18 | Discharge: 2024-01-18 | Disposition: A | Discharge status: Home / Self Care | Source: Ambulatory Visit | Attending: Internal Medicine | Admitting: Internal Medicine

## 2024-01-18 DIAGNOSIS — Z1231 Encounter for screening mammogram for malignant neoplasm of breast: Secondary | ICD-10-CM

## 2024-01-20 LAB — PROTEIN ELECTROPHORESIS, SERUM
A/G Ratio: 1.4 (ref 0.7–1.7)
Albumin ELP: 4 g/dL (ref 2.9–4.4)
Alpha 1: 0.2 g/dL (ref 0.0–0.4)
Alpha 2: 0.5 g/dL (ref 0.4–1.0)
Beta: 1.1 g/dL (ref 0.7–1.3)
Gamma Globulin: 1.1 g/dL (ref 0.4–1.8)
Globulin, Total: 2.9 g/dL (ref 2.2–3.9)
Total Protein: 6.9 g/dL (ref 6.0–8.5)

## 2024-01-20 LAB — PTH, INTACT AND CALCIUM
Calcium: 9.9 mg/dL (ref 8.7–10.2)
PTH: 33 pg/mL (ref 15–65)

## 2024-01-23 NOTE — Assessment & Plan Note (Signed)
 Weight gain of 7 pounds since March, likely due to dietary habits and possibly estrogen patch use. BMI is 30, indicating obesity. Discussed potential insulin  resistance and metformin use if confirmed. - Refer to [facility] for meal planning and metabolic rate assessment. - Encourage high protein diet with at least 100 grams of protein daily. - Suggest reducing portion sizes when eating out. - Advise on protein supplementation and intentional protein intake. - Check insulin  level and perform prediabetes test. - Consider metformin if insulin  resistance is confirmed.

## 2024-01-23 NOTE — Assessment & Plan Note (Signed)
 Previous labs reviewed, her A1c has been elevated in the past. I will check an A1c today. Reminded to avoid refined sugars including sugary drinks/foods and processed meats including bacon, sausages and deli meats.

## 2024-01-23 NOTE — Assessment & Plan Note (Signed)
 Estrogen patch use over five years may contribute to weight gain. Benefits include reduced hot flashes and improved skin condition. Discussed gradual weaning to mitigate breast cancer risk while monitoring symptoms. - Reduce estrogen patch use to once a week for four weeks, then reassess.

## 2024-01-23 NOTE — Assessment & Plan Note (Signed)
 Blood pressure well-controlled, likely due to regular exercise. Elevated heart rate at 103 bpm, possibly due to dehydration or lack of electrolytes. - Encourage hydration and consider electrolyte supplementation twice a week. - Discuss options like coconut water and Smart Water for electrolyte intake.

## 2024-01-24 ENCOUNTER — Ambulatory Visit: Payer: Self-pay | Admitting: Internal Medicine

## 2024-03-10 ENCOUNTER — Other Ambulatory Visit: Payer: Self-pay | Admitting: Internal Medicine

## 2024-03-10 DIAGNOSIS — I10 Essential (primary) hypertension: Secondary | ICD-10-CM

## 2024-03-10 DIAGNOSIS — E7800 Pure hypercholesterolemia, unspecified: Secondary | ICD-10-CM

## 2024-03-10 DIAGNOSIS — F419 Anxiety disorder, unspecified: Secondary | ICD-10-CM

## 2024-03-23 ENCOUNTER — Ambulatory Visit: Payer: Self-pay | Admitting: Internal Medicine

## 2024-04-18 ENCOUNTER — Other Ambulatory Visit: Payer: Self-pay | Admitting: Internal Medicine

## 2024-04-18 DIAGNOSIS — N951 Menopausal and female climacteric states: Secondary | ICD-10-CM

## 2024-06-09 ENCOUNTER — Emergency Department (HOSPITAL_COMMUNITY)

## 2024-06-09 ENCOUNTER — Encounter (HOSPITAL_COMMUNITY): Payer: Self-pay | Admitting: Emergency Medicine

## 2024-06-09 ENCOUNTER — Emergency Department (HOSPITAL_COMMUNITY)
Admission: EM | Admit: 2024-06-09 | Discharge: 2024-06-09 | Disposition: A | Discharge status: Home / Self Care | Attending: Emergency Medicine | Admitting: Emergency Medicine

## 2024-06-09 DIAGNOSIS — M25562 Pain in left knee: Secondary | ICD-10-CM

## 2024-06-09 DIAGNOSIS — X509XXA Other and unspecified overexertion or strenuous movements or postures, initial encounter: Secondary | ICD-10-CM | POA: Insufficient documentation

## 2024-06-09 DIAGNOSIS — M25462 Effusion, left knee: Secondary | ICD-10-CM | POA: Insufficient documentation

## 2024-06-09 MED ORDER — PREDNISONE 20 MG PO TABS
60.0000 mg | ORAL_TABLET | Freq: Once | ORAL | Status: AC
  Administered 2024-06-09: 60 mg via ORAL
  Filled 2024-06-09: qty 3

## 2024-06-09 MED ORDER — OXYCODONE-ACETAMINOPHEN 5-325 MG PO TABS
ORAL_TABLET | ORAL | Status: AC
  Filled 2024-06-09: qty 2

## 2024-06-09 MED ORDER — METHYLPREDNISOLONE 4 MG PO TBPK: ORAL_TABLET | ORAL | 0 refills | Status: AC

## 2024-06-09 MED ORDER — OXYCODONE-ACETAMINOPHEN 5-325 MG PO TABS
2.0000 | ORAL_TABLET | Freq: Once | ORAL | Status: AC
  Administered 2024-06-09: 2 via ORAL

## 2024-06-09 MED ORDER — OXYCODONE-ACETAMINOPHEN 5-325 MG PO TABS
1.0000 | ORAL_TABLET | Freq: Once | ORAL | Status: AC
  Administered 2024-06-09: 1 via ORAL
  Filled 2024-06-09: qty 1

## 2024-06-09 MED ORDER — OXYCODONE-ACETAMINOPHEN 5-325 MG PO TABS: 1.0000 | ORAL_TABLET | Freq: Four times a day (QID) | ORAL | 0 refills | Status: AC | PRN

## 2024-06-09 NOTE — Progress Notes (Signed)
 Orthopedic Tech Progress Note Patient Details:  Candice Hughes 05/18/1971 [id number]  Ortho Devices Type of Ortho Device: Knee Immobilizer, Crutches Ortho Device/Splint Location: LLE Ortho Device/Splint Interventions: Application, Adjustment, Ordered   Post Interventions Patient Tolerated: Well Instructions Provided: Poper ambulation with device, Care of device, Adjustment of device  [name] A Auburn Hester 06/09/2024, 5:16 PM

## 2024-06-09 NOTE — ED Provider Notes (Signed)
 [place] EMERGENCY DEPARTMENT AT [facility] Provider Note   CSN: [id number] Arrival date & time: 06/09/24  0522     Patient presents with: Knee Pain   Candice Hughes is a 53 y.o. female who presenting with left knee pain.  Patient states that she has been going up and down the stairs putting up Christmas decorations and noticed worsening left knee swelling for several days.  She states that it is worse when she walks.  She is concerned that she may have a blood clot.  She denies any trauma or injury to the knee.  Denies any previous knee surgeries.   The history is provided by the patient.       Prior to Admission medications  Medication Sig Start Date End Date Taking? Authorizing Provider  ALPRAZolam  (XANAX ) 0.25 MG tablet Take 1 tablet (0.25 mg total) by mouth daily as needed. 04/14/22   Jarold Medici, MD  benzonatate  (TESSALON  PERLES) 100 MG capsule Take 1 capsule (100 mg total) by mouth 3 (three) times daily as needed. 08/12/23 08/11/24  Petrina Pries, NP  Cholecalciferol (VITAMIN D-3 PO) Take 2,000 Units by mouth daily.     [provider]  citalopram  (CELEXA ) 10 MG tablet TAKE 1 TABLET BY MOUTH EVERY DAY 03/10/24   Jarold Medici, MD  estradiol  (VIVELLE -DOT) 0.025 MG/24HR PLACE 1 PATCH ONTO THE SKIN 2 TIMES A WEEK. 04/19/24   Jarold Medici, MD  fexofenadine (ALLEGRA) 180 MG tablet Take 180 mg by mouth daily.    [provider]  fluticasone (FLONASE) 50 MCG/ACT nasal spray SPRAY 1 SPRAY INTO EACH NOSTRIL EVERY DAY 02/10/21   Jarold Medici, MD  ibuprofen  (ADVIL ,MOTRIN ) 600 MG tablet Take 1 tablet (600 mg total) by mouth every 6 (six) hours as needed (mild pain). 12/23/11   Marget Lenis, MD  latanoprost (XALATAN) 0.005 % ophthalmic solution INSTILL 1 DROP INTO BOTH EYES EVERY DAY AT NIGHT 09/26/21   [provider]  pravastatin  (PRAVACHOL ) 40 MG tablet TAKE 1 TABLET BY MOUTH EVERY DAY IN THE EVENING 03/10/24   Jarold Medici, MD  spironolactone   (ALDACTONE ) 25 MG tablet TAKE 1 TABLET (25 MG TOTAL) BY MOUTH DAILY. 03/10/24 03/10/25  Jarold Medici, MD  triamcinolone  cream (KENALOG ) 0.1 % APPLY TO AFFECTED AREA TWICE DAILY AS NEEDED 03/02/23   Jarold Medici, MD    Allergies: Iodinated contrast media and Iohexol    Review of Systems  Musculoskeletal:        Left knee pain  All other systems reviewed and are negative.   Updated Vital Signs BP 137/88 (BP Location: Right Arm)   Pulse 79   Temp 98 F (36.7 C)   Resp 17   LMP 11/24/2011   SpO2 98%   Physical Exam Vitals and nursing note reviewed.  Constitutional:      Comments: Uncomfortable  HENT:     Head: Normocephalic.     Nose: Nose normal.     Mouth/Throat:     Mouth: Mucous membranes are moist.  Eyes:     Extraocular Movements: Extraocular movements intact.     Pupils: Pupils are equal, round, and reactive to light.  Cardiovascular:     Rate and Rhythm: Normal rate and regular rhythm.     Pulses: Normal pulses.     Heart sounds: Normal heart sounds.  Pulmonary:     Effort: Pulmonary effort is normal.     Breath sounds: Normal breath sounds.  Abdominal:     General: Abdomen is flat.  Palpations: Abdomen is soft.  Musculoskeletal:     Cervical back: Normal range of motion and neck supple.     Comments: Left knee is slightly swollen and decreased range of motion due to pain and swelling.  Patient has negative drawer sign.  Patient has no signs of septic joint.  Skin:    General: Skin is warm.  Neurological:     General: No focal deficit present.     Mental Status: She is oriented to person, place, and time.  Psychiatric:        Mood and Affect: Mood normal.        Behavior: Behavior normal.     (all labs ordered are listed, but only abnormal results are displayed) Labs Reviewed - No data to display  EKG: None  Radiology: VAS US  LOWER EXTREMITY VENOUS (DVT) (ONLY [facility] & [facility]) Result Date: 06/09/2024  Lower Venous DVT Study Patient Name:  Candice Hughes  Date of Exam:   06/09/2024 Medical Rec #: [id number]          Accession #:    [id number] Date of Birth: 1971-01-06           Patient Gender: F Patient Age:   52 years Exam Location:  [facility] Procedure:      VAS US  LOWER EXTREMITY VENOUS (DVT) Referring Phys: LAMAR BROWNING --------------------------------------------------------------------------------  Indications: Knee pain.  Comparison Study: No previous exams Performing Technologist: Jody Hill RVT, RDMS  Examination Guidelines: A complete evaluation includes B-mode imaging, spectral Doppler, color Doppler, and power Doppler as needed of all accessible portions of each vessel. Bilateral testing is considered an integral part of a complete examination. Limited examinations for reoccurring indications may be performed as noted. The reflux portion of the exam is performed with the patient in reverse Trendelenburg.  +-----+---------------+---------+-----------+----------+--------------+ RIGHTCompressibilityPhasicitySpontaneityPropertiesThrombus Aging +-----+---------------+---------+-----------+----------+--------------+ CFV  Full           Yes      Yes                                 +-----+---------------+---------+-----------+----------+--------------+   +---------+---------------+---------+-----------+----------+--------------+ LEFT     CompressibilityPhasicitySpontaneityPropertiesThrombus Aging +---------+---------------+---------+-----------+----------+--------------+ CFV      Full           Yes      Yes                                 +---------+---------------+---------+-----------+----------+--------------+ SFJ      Full                                                        +---------+---------------+---------+-----------+----------+--------------+ FV Prox  Full           Yes      Yes                                 +---------+---------------+---------+-----------+----------+--------------+ FV  Mid   Full           Yes      Yes                                 +---------+---------------+---------+-----------+----------+--------------+  FV DistalFull           Yes      Yes                                 +---------+---------------+---------+-----------+----------+--------------+ PFV      Full                                                        +---------+---------------+---------+-----------+----------+--------------+ POP      Full           Yes      Yes                                 +---------+---------------+---------+-----------+----------+--------------+ PTV      Full                                                        +---------+---------------+---------+-----------+----------+--------------+ PERO     Full                                                        +---------+---------------+---------+-----------+----------+--------------+     Summary: RIGHT: - No evidence of common femoral vein obstruction.   LEFT: - There is no evidence of deep vein thrombosis in the lower extremity.  - No cystic structure found in the popliteal fossa.  *See table(s) above for measurements and observations.    Preliminary    DG Knee Complete 4 Views Left Result Date: 06/09/2024 CLINICAL DATA:  Knee pain EXAM: LEFT KNEE - COMPLETE 4+ VIEW COMPARISON:  None Available. FINDINGS: No evidence of fracture, dislocation, or joint effusion. No evidence of arthropathy or other focal bone abnormality. Soft tissues are unremarkable. IMPRESSION: Negative. Electronically Signed   By: Camellia Candle M.D.   On: 06/09/2024 07:03     Procedures   Medications Ordered in the ED  predniSONE (DELTASONE) tablet 60 mg (has no administration in time range)  oxyCODONE -acetaminophen  (PERCOCET/ROXICET) 5-325 MG per tablet 1 tablet (has no administration in time range)  oxyCODONE -acetaminophen  (PERCOCET/ROXICET) 5-325 MG per tablet 2 tablet (2 tablets Oral Given 06/09/24 0542)                                     Medical Decision Making Candice Hughes is a 53 y.o. female here presenting with left knee pain and swelling.  Patient has no obvious septic joint on my exam.  DVT study done in triage is negative and x-ray did not show any fracture or obvious arthritis.  Patient is concerned for the swelling and I performed bedside ultrasound there is minimal suprapatella effusion but not enough to perform a arthrocentesis.  I think patient likely has either tore a ligament or have knee effusion from overuse.  Patient was given a course of steroids  and pain medicine and knee immobilizer and crutches.  Will refer to Ortho outpatient to get MRI if she has persistent swelling   Problems Addressed: Pain and swelling of knee, left: acute illness or injury  Amount and/or Complexity of Data Reviewed Radiology: ordered and independent interpretation performed. Decision-making details documented in ED Course.  Risk Prescription drug management.     Final diagnoses:  None    ED Discharge Orders     None          Patt Alm Macho, MD 06/09/24 1659

## 2024-06-09 NOTE — ED Provider Triage Note (Signed)
 Emergency Medicine Provider Triage Evaluation Note  Candice Hughes , a 53 y.o. female  was evaluated in triage.  Pt complains of left knee pain.  States that she had been going up and down the stairs getting up Christmas decorations this week and thought that the knee pain was from using the stairs.  She states that she awoke this morning with significant pain in the left knee.  Denies history of PE or DVT.  Denies any chest pain or shortness of breath.  Denies any redness fever.  She does note some mild swelling to the medial side of the left knee..  Review of Systems  Positive: Left knee pain Negative:   Physical Exam  BP (!) 141/107 (BP Location: Right Arm)   Pulse 100   Temp 97.9 F (36.6 C) (Oral)   Resp (!) 24   LMP 11/24/2011   SpO2 100%  Gen:   Awake, no distress   Resp:  Normal effort  MSK:   Moves extremities without difficulty  Other:    Medical Decision Making  Medically screening exam initiated at 5:38 AM.  Appropriate orders placed.  MAILI SHUTTERS was informed that the remainder of the evaluation will be completed by another provider, this initial triage assessment does not replace that evaluation, and the importance of remaining in the ED until their evaluation is complete.     Vicky Charleston, PA-C 06/09/24 [phone]

## 2024-06-09 NOTE — Discharge Instructions (Addendum)
 As we discussed, you may have torn a ligament.  Your x-rays and ultrasounds were normal today  I have prescribed some steroids to help with the swelling  Take Tylenol  Motrin  for pain Percocet for severe pain  I recommend that you follow-up with orthopedic doctor to get an MRI if you have persistent swelling and pain  Please use crutches and knee immobilizers for comfort  Return to ER if you have uncontrolled pain or trouble walking.

## 2024-06-09 NOTE — ED Triage Notes (Addendum)
 Pt is hyperventilating in triage stating pain is 10/10 in her knee despite attempting to treat her pain with OTC remedies. She states she has increase swelling and thinks DVT. Pt has pulse in that leg. Pt reports it is a throbbing/ stabbing pain. States hot in one area and pain is generalized to all over knee.

## 2024-06-09 NOTE — ED Notes (Signed)
 Pt leaving for home with boyfriend. Paper and education reviewed work note given. Pt leaving in no new onset distress.

## 2024-06-09 NOTE — Progress Notes (Signed)
 LLE venous duplex has been completed.  Preliminary results given to Johana Soto, PA-C.   Results can be found under chart review under CV PROC. 06/09/2024 9:25 AM Doctor Sheahan RVT, RDMS

## 2024-06-12 ENCOUNTER — Ambulatory Visit: Payer: Self-pay | Admitting: Internal Medicine

## 2024-06-12 ENCOUNTER — Encounter: Payer: Self-pay | Admitting: Internal Medicine

## 2024-06-12 VITALS — BP 128/82 | HR 85 | Temp 98.3°F | Ht 63.0 in | Wt 172.4 lb

## 2024-06-12 DIAGNOSIS — Z6830 Body mass index (BMI) 30.0-30.9, adult: Secondary | ICD-10-CM | POA: Diagnosis not present

## 2024-06-12 DIAGNOSIS — I10 Essential (primary) hypertension: Secondary | ICD-10-CM | POA: Diagnosis not present

## 2024-06-12 DIAGNOSIS — E6609 Other obesity due to excess calories: Secondary | ICD-10-CM | POA: Diagnosis not present

## 2024-06-12 DIAGNOSIS — R7309 Other abnormal glucose: Secondary | ICD-10-CM | POA: Diagnosis not present

## 2024-06-12 DIAGNOSIS — E7800 Pure hypercholesterolemia, unspecified: Secondary | ICD-10-CM | POA: Diagnosis not present

## 2024-06-12 DIAGNOSIS — Z638 Other specified problems related to primary support group: Secondary | ICD-10-CM

## 2024-06-12 DIAGNOSIS — M25562 Pain in left knee: Secondary | ICD-10-CM | POA: Insufficient documentation

## 2024-06-12 DIAGNOSIS — E66811 Obesity, class 1: Secondary | ICD-10-CM | POA: Diagnosis not present

## 2024-06-12 NOTE — Patient Instructions (Signed)
 Hypertension, Adult Hypertension is another name for high blood pressure. High blood pressure forces your heart to work harder to pump blood. This can cause problems over time. There are two numbers in a blood pressure reading. There is a top number (systolic) over a bottom number (diastolic). It is best to have a blood pressure that is below 120/80. What are the causes? The cause of this condition is not known. Some other conditions can lead to high blood pressure. What increases the risk? Some lifestyle factors can make you more likely to develop high blood pressure: Smoking. Not getting enough exercise or physical activity. Being overweight. Having too much fat, sugar, calories, or salt (sodium) in your diet. Drinking too much alcohol. Other risk factors include: Having any of these conditions: Heart disease. Diabetes. High cholesterol. Kidney disease. Obstructive sleep apnea. Having a family history of high blood pressure and high cholesterol. Age. The risk increases with age. Stress. What are the signs or symptoms? High blood pressure may not cause symptoms. Very high blood pressure (hypertensive crisis) may cause: Headache. Fast or uneven heartbeats (palpitations). Shortness of breath. Nosebleed. Vomiting or feeling like you may vomit (nauseous). Changes in how you see. Very bad chest pain. Feeling dizzy. Seizures. How is this treated? This condition is treated by making healthy lifestyle changes, such as: Eating healthy foods. Exercising more. Drinking less alcohol. Your doctor may prescribe medicine if lifestyle changes do not help enough and if: Your top number is above 130. Your bottom number is above 80. Your personal target blood pressure may vary. Follow these instructions at home: Eating and drinking  If told, follow the DASH eating plan. To follow this plan: Fill one half of your plate at each meal with fruits and vegetables. Fill one fourth of your plate  at each meal with whole grains. Whole grains include whole-wheat pasta, brown rice, and whole-grain bread. Eat or drink low-fat dairy products, such as skim milk or low-fat yogurt. Fill one fourth of your plate at each meal with low-fat (lean) proteins. Low-fat proteins include fish, chicken without skin, eggs, beans, and tofu. Avoid fatty meat, cured and processed meat, or chicken with skin. Avoid pre-made or processed food. Limit the amount of salt in your diet to less than 1,500 mg each day. Do not drink alcohol if: Your doctor tells you not to drink. You are pregnant, may be pregnant, or are planning to become pregnant. If you drink alcohol: Limit how much you have to: 0-1 drink a day for women. 0-2 drinks a day for men. Know how much alcohol is in your drink. In the [place] one drink equals one 12 oz bottle of beer (355 mL), one 5 oz glass of wine (148 mL), or one 1 oz glass of hard liquor (44 mL). Lifestyle  Work with your doctor to stay at a healthy weight or to lose weight. Ask your doctor what the best weight is for you. Get at least 30 minutes of exercise that causes your heart to beat faster (aerobic exercise) most days of the week. This may include walking, swimming, or biking. Get at least 30 minutes of exercise that strengthens your muscles (resistance exercise) at least 3 days a week. This may include lifting weights or doing Pilates. Do not smoke or use any products that contain nicotine or tobacco. If you need help quitting, ask your doctor. Check your blood pressure at home as told by your doctor. Keep all follow-up visits. Medicines Take over-the-counter and prescription medicines  only as told by your doctor. Follow directions carefully. Do not skip doses of blood pressure medicine. The medicine does not work as well if you skip doses. Skipping doses also puts you at risk for problems. Ask your doctor about side effects or reactions to medicines that you should watch  for. Contact a doctor if: You think you are having a reaction to the medicine you are taking. You have headaches that keep coming back. You feel dizzy. You have swelling in your ankles. You have trouble with your vision. Get help right away if: You get a very bad headache. You start to feel mixed up (confused). You feel weak or numb. You feel faint. You have very bad pain in your: Chest. Belly (abdomen). You vomit more than once. You have trouble breathing. These symptoms may be an emergency. Get help right away. Call 911. Do not wait to see if the symptoms will go away. Do not drive yourself to the hospital. Summary Hypertension is another name for high blood pressure. High blood pressure forces your heart to work harder to pump blood. For most people, a normal blood pressure is less than 120/80. Making healthy choices can help lower blood pressure. If your blood pressure does not get lower with healthy choices, you may need to take medicine. This information is not intended to replace advice given to you by your health care provider. Make sure you discuss any questions you have with your health care provider. Document Revised: 04/03/2021 Document Reviewed: 04/03/2021 Elsevier Patient Education  2024 [employer].

## 2024-06-12 NOTE — Assessment & Plan Note (Addendum)
Chronic, currently on pravastatin 34m daily. Encouraged to follow heart healthy lifestyle. Advised to aim for at least 150 minutes of exercise/week, diet free of fried/processed foods and adequate sleep 7-8 hours per night.

## 2024-06-12 NOTE — Assessment & Plan Note (Addendum)
 Previous labs reviewed, her A1c has been elevated in the past. I will check an A1c today. Reminded to avoid refined sugars including sugary drinks/foods and processed meats including bacon, sausages and deli meats.

## 2024-06-12 NOTE — Assessment & Plan Note (Signed)
 ER records reviewed.  Acute left knee pain and swelling with no abnormalities on imaging. Differential includes gout, rheumatoid arthritis, or ligament injury. Fluid present but insufficient for drainage. Currently on prednisone . - Ordered ANA test for autoimmune screening. - Ordered liver and kidney function tests. - Continue prednisone . - Apply Voltaren gel to knee. - Consider orthopedic referral if labs normal.

## 2024-06-12 NOTE — Progress Notes (Signed)
 cI,Victoria T Hamilton, CMA,acting as a neurosurgeon for [name], MD.,have documented all relevant documentation on the behalf of [name], MD,as directed by  [name], MD while in the presence of [name], MD.  Subjective:  Patient ID: Candice Hughes , female    DOB: July 20, 1970 , 53 y.o.   MRN: [id number]  Chief Complaint  Patient presents with   Hypertension    Patient presents today for bp & cholesterol follow up. She reports compliance with medications. Denies headache, chest pain & sob. She reports going to ER on Friday. She woke up with intense left knee pain. She states it was swollen as the size of a grapefruit. Xray done & no abnormalities found.  After Friday she has not experienced the same pain   Hyperlipidemia    HPI Discussed the use of AI scribe software for clinical note transcription with the patient, who gave verbal consent to proceed.  History of Present Illness Candice Hughes is a 53 year old female with hypertension and sleep apnea who presents for a blood pressure check and evaluation of acute knee pain.  She has been experiencing acute knee pain since Friday, described as excruciating with significant swelling comparable to a grapefruit, rendering her unable to stand. The swelling began while using stairs, and she has had similar episodes of swelling in the past without a clear cause. She attempted to manage the pain with ice, Voltaren gel, and ibuprofen , but these measures were ineffective.  In the emergency room, an ultrasound and x-ray were performed, both showing no abnormalities. She was told in the ER that it could possibly be gout or rheumatoid arthritis, but there was no history of trauma or falls. She is currently taking prednisone  and continues to use Voltaren gel. Although she has oxycodone  available, she has not used it recently.  She maintains a regular exercise routine focused on weights but has reduced cardio and avoids lunges  and squats due to knee issues.  She has a history of hypertension and monitors her blood pressure at home. Her current medications include spironolactone , which she has reduced to half a tablet every day or every other day, and pravastatin  for cholesterol. She has discontinued citalopram  and estradiol  patches.  She has sleep apnea and uses a retainer a few times a week, though she finds it uncomfortable. 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 She takes naps during the day to manage fatigue and reports feeling refreshed after naps. She occasionally wakes up with a headache lasting around 30-45 minutes.  c Hypertension This is a chronic problem. The current episode started more than 1 year ago. The problem has been gradually improving since onset. The problem is controlled. Pertinent negatives include no blurred vision, chest pain, palpitations or shortness of breath. The current treatment provides moderate improvement.     Past Medical History:  Diagnosis Date   Acne 03/15/2014   Cancer [facility]) 2005   history of lymphoma-chest tumor   Headache(784.0)    History of blood transfusion 2005   surgery related- [place]   Hypertension      Family History  Problem Relation Age of Onset   Hypertension Mother    Hypertension Father    Hyperlipidemia Father    Diabetes Father    Kidney failure Father    Breast cancer Neg Hx     Current  Medications[1]   Allergies[2]   Review of Systems  Constitutional: Negative.   Eyes:  Negative for blurred vision.  Respiratory: Negative.  Negative for shortness of breath.   Cardiovascular: Negative.  Negative for chest pain and palpitations.  Gastrointestinal: Negative.   Musculoskeletal:  Positive for arthralgias.  Neurological: Negative.   Psychiatric/Behavioral: Negative.       Today's Vitals   06/12/24 1410  BP: 128/82  Pulse: 85  Temp: 98.3 F (36.8 C)  SpO2: 98%   Weight: 172 lb 6.4 oz (78.2 kg)  Height: 5' 3 (1.6 m)   Body mass index is 30.54 kg/m.  Wt Readings from Last 3 Encounters:  06/12/24 172 lb 6.4 oz (78.2 kg)  01/11/24 173 lb (78.5 kg)  09/20/23 166 lb 3.2 oz (75.4 kg)   BP Readings from Last 3 Encounters:  06/12/24 128/82  06/09/24 (!) 158/95  01/11/24 110/70      Objective:  Physical Exam Vitals and nursing note reviewed.  Constitutional:      Appearance: Normal appearance.  HENT:     Head: Normocephalic and atraumatic.  Eyes:     Extraocular Movements: Extraocular movements intact.  Cardiovascular:     Rate and Rhythm: Normal rate and regular rhythm.     Heart sounds: Normal heart sounds.  Pulmonary:     Effort: Pulmonary effort is normal.     Breath sounds: Normal breath sounds.  Musculoskeletal:     Cervical back: Normal range of motion.     Comments: Knee immobilizer on left leg  Skin:    General: Skin is warm.  Neurological:     General: No focal deficit present.     Mental Status: She is alert.  Psychiatric:        Mood and Affect: Mood normal.        Behavior: Behavior normal.      Assessment And Plan:   Assessment & Plan Essential hypertension, benign Chronic, fair control. Goal BP <130/80.  Blood pressure 128/82 mmHg, diastolic slightly elevated. Previous high reading attributed to pain. Inconsistent spironolactone  use due to exercise routine. She has self titrated to 1/2 tab spironolactone  daily.  - Continue spironolactone  daily, half tablet. - Monitor blood pressure at home. Pure hypercholesterolemia Chronic, currently on pravastatin  54m daily. Encouraged to follow heart healthy lifestyle. Advised to aim for at least 150 minutes of exercise/week, diet free of fried/processed foods and adequate sleep 7-8 hours per night.  Acute pain of left knee ER records reviewed.  Acute left knee pain and swelling with no abnormalities on imaging. Differential includes gout, rheumatoid arthritis, or ligament  injury. Fluid present but insufficient for drainage. Currently on prednisone . - Ordered ANA test for autoimmune screening. - Ordered liver and kidney function tests. - Continue prednisone . - Apply Voltaren gel to knee. - Consider orthopedic referral if labs normal. Other abnormal glucose Previous labs reviewed, her A1c has been elevated in the past. I will check an A1c today. Reminded to avoid refined sugars including sugary drinks/foods and processed meats including bacon, sausages and deli meats.   Class 1 obesity due to excess calories with serious comorbidity and body mass index (BMI) of 30.0 to 30.9 in adult BMI borderline for weight management referral, per [facility]. Maintaining weight through exercise and diet. - Resume exercise regimen once knee heals and continue dietary regimen. Stress due to family tension She agrees to start journaling. Will consider therapy in the future.   Orders Placed This Encounter  Procedures   CMP14+EGFR  Hemoglobin A1c   ANA, IFA (with reflex)   Return if symptoms worsen or fail to improve.  Patient was given opportunity to ask questions. Patient verbalized understanding of the plan and was able to repeat key elements of the plan. All questions were answered to their satisfaction.   I, [name], MD, have reviewed all documentation for this visit. The documentation on 06/12/2024 for the exam, diagnosis, procedures, and orders are all accurate and complete.   IF YOU HAVE BEEN REFERRED TO A SPECIALIST, IT MAY TAKE 1-2 WEEKS TO SCHEDULE/PROCESS THE REFERRAL. IF YOU HAVE NOT HEARD FROM US /SPECIALIST IN TWO WEEKS, PLEASE GIVE US  A CALL AT [phone].      [1]  Current Outpatient Medications:    benzonatate  (TESSALON  PERLES) 100 MG capsule, Take 1 capsule (100 mg total) by mouth 3 (three) times daily as needed., Disp: 30 capsule, Rfl: 0   Cholecalciferol (VITAMIN D-3 PO), Take 2,000 Units by mouth daily. , Disp: , Rfl:    fexofenadine  (ALLEGRA) 180 MG tablet, Take 180 mg by mouth daily., Disp: , Rfl:    fluticasone (FLONASE) 50 MCG/ACT nasal spray, SPRAY 1 SPRAY INTO EACH NOSTRIL EVERY DAY, Disp: 48 mL, Rfl: 1   latanoprost (XALATAN) 0.005 % ophthalmic solution, INSTILL 1 DROP INTO BOTH EYES EVERY DAY AT NIGHT, Disp: , Rfl:    methylPREDNISolone  (MEDROL  DOSEPAK) 4 MG TBPK tablet, Use as directed, Disp: 21 each, Rfl: 0   pravastatin  (PRAVACHOL ) 40 MG tablet, TAKE 1 TABLET BY MOUTH EVERY DAY IN THE EVENING, Disp: 90 tablet, Rfl: 1   spironolactone  (ALDACTONE ) 25 MG tablet, TAKE 1 TABLET (25 MG TOTAL) BY MOUTH DAILY., Disp: 90 tablet, Rfl: 1   triamcinolone  cream (KENALOG ) 0.1 %, APPLY TO AFFECTED AREA TWICE DAILY AS NEEDED, Disp: 45 g, Rfl: 0   oxyCODONE -acetaminophen  (PERCOCET) 5-325 MG tablet, Take 1 tablet by mouth every 6 (six) hours as needed. (Patient not taking: Reported on 06/12/2024), Disp: 10 tablet, Rfl: 0 [2]  Allergies Allergen Reactions   Iodinated Contrast Media Itching   Iohexol      Code: HIVES, Desc: hives, itching, itchy throat, premed w/ benadryl  prior to scan, (done w/ 50mg  just prior to scan 05/24/08 w/ no probs dr hall approved)

## 2024-06-12 NOTE — Assessment & Plan Note (Signed)
 Chronic, fair control. Goal BP <130/80.  Blood pressure 128/82 mmHg, diastolic slightly elevated. Previous high reading attributed to pain. Inconsistent spironolactone  use due to exercise routine. She has self titrated to 1/2 tab spironolactone  daily.  - Continue spironolactone  daily, half tablet. - Monitor blood pressure at home.

## 2024-06-12 NOTE — Assessment & Plan Note (Signed)
 BMI borderline for weight management referral, per [facility]. Maintaining weight through exercise and diet. - Resume exercise regimen once knee heals and continue dietary regimen.

## 2024-06-13 LAB — CMP14+EGFR
ALT: 44 IU/L — ABNORMAL HIGH (ref 0–32)
AST: 30 IU/L (ref 0–40)
Albumin: 4.5 g/dL (ref 3.8–4.9)
Alkaline Phosphatase: 76 IU/L (ref 49–135)
BUN/Creatinine Ratio: 22 (ref 9–23)
BUN: 19 mg/dL (ref 6–24)
Bilirubin Total: 0.2 mg/dL (ref 0.0–1.2)
CO2: 24 mmol/L (ref 20–29)
Calcium: 10.2 mg/dL (ref 8.7–10.2)
Chloride: 101 mmol/L (ref 96–106)
Creatinine, Ser: 0.85 mg/dL (ref 0.57–1.00)
Globulin, Total: 2.8 g/dL (ref 1.5–4.5)
Glucose: 83 mg/dL (ref 70–99)
Potassium: 4.6 mmol/L (ref 3.5–5.2)
Sodium: 142 mmol/L (ref 134–144)
Total Protein: 7.3 g/dL (ref 6.0–8.5)
eGFR: 81 mL/min/1.73 (ref >59)

## 2024-06-13 LAB — HEMOGLOBIN A1C
Est. average glucose Bld gHb Est-mCnc: 126 mg/dL
Hgb A1c MFr Bld: 6 % — ABNORMAL HIGH (ref 4.8–5.6)

## 2024-06-13 LAB — ANTINUCLEAR ANTIBODIES, IFA: ANA Titer 1: NEGATIVE

## 2024-06-14 LAB — SPECIMEN STATUS REPORT

## 2024-06-14 LAB — URIC ACID: Uric Acid: 4.2 mg/dL (ref 3.0–7.2)

## 2024-06-28 ENCOUNTER — Ambulatory Visit: Payer: Self-pay

## 2024-06-28 NOTE — Telephone Encounter (Signed)
 FYI Only or Action Required?: Action required by provider: antibiotic requests to [facility], refused urgent care, requests cb [phone].  Patient was last seen in primary care on 06/12/2024 by Jarold Medici, MD.  Called Nurse Triage reporting Cough and Ear Fullness.  Symptoms began 8 days ago.  Interventions attempted: OTC medications: Aleve, Delysm, DayQuil, Black Seed Oil and Rest, hydration, or home remedies.  Symptoms are: unchanged.  Triage Disposition: See Physician Within 24 Hours  Patient/caregiver understands and will follow disposition?: No, wishes to speak with PCP   Reason for Disposition  [facility]  Answer Assessment - Initial Assessment Questions Patient reports symptoms started on 06/20/24, worst day was 06/22/24. She reports a mild productive cough of clear and light yellow sputum, denies breathing difficulties and chest pain. She reports her ears are stopped up left great than right. She has been treating her symptoms at home and has tried Aleve D, Delysm, Dayquil, and black seed oil but has not noted any improvement. She is requesting antibiotics to pharmacy. Acute visit advised, none are available today, advised urgent care, patient declines urgent care and requesting rx to [facility] today.  Please advise patient call back [phone]   1. ONSET: When did the cough begin?      06/20/24 2. SEVERITY: How bad is the cough today?      mild 3. SPUTUM: Describe the color of your sputum (e.g., none, dry cough; clear, white, yellow, green)     Yellowish  4. HEMOPTYSIS: Are you coughing up any blood? If Yes, ask: How much? (e.g., flecks, streaks, tablespoons, etc.)     denies 5. DIFFICULTY BREATHING: Are you having difficulty breathing? If Yes, ask: How bad is it? (e.g., mild, moderate, severe)      Denies  6. FEVER: Do you have a fever? If Yes, ask: What is your temperature, how was it measured, and when did it start?     Denies  7. CARDIAC HISTORY: Do you  have any history of heart disease? (e.g., heart attack, congestive heart failure)       8. LUNG HISTORY: Do you have any history of lung disease?  (e.g., pulmonary embolus, asthma, emphysema)      9. PE RISK FACTORS: Do you have a history of blood clots? (or: recent major surgery, recent prolonged travel, bedridden)      10. OTHER SYMPTOMS: Do you have any other symptoms? (e.g., runny nose, wheezing, chest pain)       Ear are congested left worse than right  Protocols used: Cough - Acute Productive-A-AH Copied from CRM #[id number]. Topic: Clinical - Medication Question >> Jun 28, 2024 10:08 AM Willma R wrote: Reason for CRM: Patient states she was in last week but now thinks she has a sinus infection, states her ears feel clogged, and has a cough and runny nose. Is requesting to see if she is able to get a prescription but doesn't want to come in to the office.  Patient can be reached at [phone]

## 2024-07-03 ENCOUNTER — Ambulatory Visit: Payer: Self-pay | Admitting: Internal Medicine

## 2024-09-27 ENCOUNTER — Encounter: Payer: Self-pay | Admitting: Internal Medicine

## 2024-10-25 ENCOUNTER — Encounter: Payer: Self-pay | Admitting: Internal Medicine
